# Patient Record
Sex: Male | Born: 1950 | Race: White | Hispanic: No | Marital: Married | Smoking: Never smoker
Health system: Southern US, Community
[De-identification: ages and names within clinical notes are randomized; demographics above are authoritative.]

## PROBLEM LIST (undated history)

## (undated) DIAGNOSIS — I10 Essential (primary) hypertension: Secondary | ICD-10-CM

## (undated) DIAGNOSIS — E7800 Pure hypercholesterolemia, unspecified: Secondary | ICD-10-CM

## (undated) DIAGNOSIS — J302 Other seasonal allergic rhinitis: Secondary | ICD-10-CM

## (undated) DIAGNOSIS — G20A1 Parkinson's disease without dyskinesia, without mention of fluctuations: Secondary | ICD-10-CM

## (undated) DIAGNOSIS — R269 Unspecified abnormalities of gait and mobility: Secondary | ICD-10-CM

## (undated) DIAGNOSIS — G20 Parkinson's disease: Secondary | ICD-10-CM

## (undated) DIAGNOSIS — E119 Type 2 diabetes mellitus without complications: Secondary | ICD-10-CM

## (undated) HISTORY — DX: Type 2 diabetes mellitus without complications: E11.9

## (undated) HISTORY — PX: HERNIA REPAIR: SHX51

## (undated) HISTORY — DX: Pure hypercholesterolemia, unspecified: E78.00

## (undated) HISTORY — PX: CHOLECYSTECTOMY: SHX55

## (undated) HISTORY — DX: Parkinson's disease without dyskinesia, without mention of fluctuations: G20.A1

## (undated) HISTORY — DX: Other seasonal allergic rhinitis: J30.2

## (undated) HISTORY — PX: APPENDECTOMY: SHX54

## (undated) HISTORY — DX: Unspecified abnormalities of gait and mobility: R26.9

## (undated) HISTORY — DX: Essential (primary) hypertension: I10

## (undated) HISTORY — DX: Parkinson's disease: G20

## (undated) HISTORY — PX: COLONOSCOPY: SHX174

---

## 2000-12-16 ENCOUNTER — Ambulatory Visit (HOSPITAL_COMMUNITY): Admission: RE | Admit: 2000-12-16 | Discharge: 2000-12-16 | Payer: Self-pay | Admitting: General Surgery

## 2011-11-30 DIAGNOSIS — R109 Unspecified abdominal pain: Secondary | ICD-10-CM

## 2015-09-21 DIAGNOSIS — Z0000 Encounter for general adult medical examination without abnormal findings: Secondary | ICD-10-CM | POA: Diagnosis not present

## 2015-09-21 DIAGNOSIS — Z1389 Encounter for screening for other disorder: Secondary | ICD-10-CM | POA: Diagnosis not present

## 2015-09-21 DIAGNOSIS — E1165 Type 2 diabetes mellitus with hyperglycemia: Secondary | ICD-10-CM | POA: Diagnosis not present

## 2015-09-21 DIAGNOSIS — Z1211 Encounter for screening for malignant neoplasm of colon: Secondary | ICD-10-CM | POA: Diagnosis not present

## 2015-09-21 DIAGNOSIS — E7800 Pure hypercholesterolemia, unspecified: Secondary | ICD-10-CM | POA: Diagnosis not present

## 2015-09-21 DIAGNOSIS — R5383 Other fatigue: Secondary | ICD-10-CM | POA: Diagnosis not present

## 2015-09-21 DIAGNOSIS — Z7189 Other specified counseling: Secondary | ICD-10-CM | POA: Diagnosis not present

## 2015-09-21 DIAGNOSIS — Z6827 Body mass index (BMI) 27.0-27.9, adult: Secondary | ICD-10-CM | POA: Diagnosis not present

## 2015-09-21 DIAGNOSIS — Z299 Encounter for prophylactic measures, unspecified: Secondary | ICD-10-CM | POA: Diagnosis not present

## 2015-09-22 DIAGNOSIS — E7800 Pure hypercholesterolemia, unspecified: Secondary | ICD-10-CM | POA: Diagnosis not present

## 2015-09-22 DIAGNOSIS — Z125 Encounter for screening for malignant neoplasm of prostate: Secondary | ICD-10-CM | POA: Diagnosis not present

## 2015-09-22 DIAGNOSIS — R5383 Other fatigue: Secondary | ICD-10-CM | POA: Diagnosis not present

## 2015-09-22 DIAGNOSIS — Z79899 Other long term (current) drug therapy: Secondary | ICD-10-CM | POA: Diagnosis not present

## 2015-10-17 DIAGNOSIS — H5203 Hypermetropia, bilateral: Secondary | ICD-10-CM | POA: Diagnosis not present

## 2015-10-17 DIAGNOSIS — H52223 Regular astigmatism, bilateral: Secondary | ICD-10-CM | POA: Diagnosis not present

## 2015-10-17 DIAGNOSIS — E119 Type 2 diabetes mellitus without complications: Secondary | ICD-10-CM | POA: Diagnosis not present

## 2015-10-17 DIAGNOSIS — Z7984 Long term (current) use of oral hypoglycemic drugs: Secondary | ICD-10-CM | POA: Diagnosis not present

## 2015-11-10 DIAGNOSIS — E7800 Pure hypercholesterolemia, unspecified: Secondary | ICD-10-CM | POA: Diagnosis not present

## 2015-11-10 DIAGNOSIS — E119 Type 2 diabetes mellitus without complications: Secondary | ICD-10-CM | POA: Diagnosis not present

## 2015-12-21 DIAGNOSIS — Z23 Encounter for immunization: Secondary | ICD-10-CM | POA: Diagnosis not present

## 2016-01-16 DIAGNOSIS — Z713 Dietary counseling and surveillance: Secondary | ICD-10-CM | POA: Diagnosis not present

## 2016-01-16 DIAGNOSIS — Z299 Encounter for prophylactic measures, unspecified: Secondary | ICD-10-CM | POA: Diagnosis not present

## 2016-01-16 DIAGNOSIS — Z6827 Body mass index (BMI) 27.0-27.9, adult: Secondary | ICD-10-CM | POA: Diagnosis not present

## 2016-01-16 DIAGNOSIS — E1165 Type 2 diabetes mellitus with hyperglycemia: Secondary | ICD-10-CM | POA: Diagnosis not present

## 2016-05-08 DIAGNOSIS — Z299 Encounter for prophylactic measures, unspecified: Secondary | ICD-10-CM | POA: Diagnosis not present

## 2016-05-08 DIAGNOSIS — Z713 Dietary counseling and surveillance: Secondary | ICD-10-CM | POA: Diagnosis not present

## 2016-05-08 DIAGNOSIS — M25511 Pain in right shoulder: Secondary | ICD-10-CM | POA: Diagnosis not present

## 2016-05-08 DIAGNOSIS — E1165 Type 2 diabetes mellitus with hyperglycemia: Secondary | ICD-10-CM | POA: Diagnosis not present

## 2016-05-08 DIAGNOSIS — Z6826 Body mass index (BMI) 26.0-26.9, adult: Secondary | ICD-10-CM | POA: Diagnosis not present

## 2016-08-21 DIAGNOSIS — H2513 Age-related nuclear cataract, bilateral: Secondary | ICD-10-CM | POA: Diagnosis not present

## 2016-08-21 DIAGNOSIS — Z7984 Long term (current) use of oral hypoglycemic drugs: Secondary | ICD-10-CM | POA: Diagnosis not present

## 2016-08-21 DIAGNOSIS — H43812 Vitreous degeneration, left eye: Secondary | ICD-10-CM | POA: Diagnosis not present

## 2016-08-21 DIAGNOSIS — E119 Type 2 diabetes mellitus without complications: Secondary | ICD-10-CM | POA: Diagnosis not present

## 2016-08-28 DIAGNOSIS — E7800 Pure hypercholesterolemia, unspecified: Secondary | ICD-10-CM | POA: Diagnosis not present

## 2016-08-28 DIAGNOSIS — E1165 Type 2 diabetes mellitus with hyperglycemia: Secondary | ICD-10-CM | POA: Diagnosis not present

## 2016-08-28 DIAGNOSIS — Z299 Encounter for prophylactic measures, unspecified: Secondary | ICD-10-CM | POA: Diagnosis not present

## 2016-08-28 DIAGNOSIS — Z713 Dietary counseling and surveillance: Secondary | ICD-10-CM | POA: Diagnosis not present

## 2016-08-28 DIAGNOSIS — Z6826 Body mass index (BMI) 26.0-26.9, adult: Secondary | ICD-10-CM | POA: Diagnosis not present

## 2016-10-03 DIAGNOSIS — Z7189 Other specified counseling: Secondary | ICD-10-CM | POA: Diagnosis not present

## 2016-10-03 DIAGNOSIS — Z0000 Encounter for general adult medical examination without abnormal findings: Secondary | ICD-10-CM | POA: Diagnosis not present

## 2016-10-03 DIAGNOSIS — Z79899 Other long term (current) drug therapy: Secondary | ICD-10-CM | POA: Diagnosis not present

## 2016-10-03 DIAGNOSIS — E1165 Type 2 diabetes mellitus with hyperglycemia: Secondary | ICD-10-CM | POA: Diagnosis not present

## 2016-10-03 DIAGNOSIS — R5383 Other fatigue: Secondary | ICD-10-CM | POA: Diagnosis not present

## 2016-10-03 DIAGNOSIS — E7800 Pure hypercholesterolemia, unspecified: Secondary | ICD-10-CM | POA: Diagnosis not present

## 2016-10-03 DIAGNOSIS — Z125 Encounter for screening for malignant neoplasm of prostate: Secondary | ICD-10-CM | POA: Diagnosis not present

## 2016-10-03 DIAGNOSIS — Z1389 Encounter for screening for other disorder: Secondary | ICD-10-CM | POA: Diagnosis not present

## 2016-10-03 DIAGNOSIS — Z1211 Encounter for screening for malignant neoplasm of colon: Secondary | ICD-10-CM | POA: Diagnosis not present

## 2016-10-03 DIAGNOSIS — Z299 Encounter for prophylactic measures, unspecified: Secondary | ICD-10-CM | POA: Diagnosis not present

## 2016-10-03 DIAGNOSIS — Z713 Dietary counseling and surveillance: Secondary | ICD-10-CM | POA: Diagnosis not present

## 2016-10-03 DIAGNOSIS — Z6826 Body mass index (BMI) 26.0-26.9, adult: Secondary | ICD-10-CM | POA: Diagnosis not present

## 2016-10-04 DIAGNOSIS — Z79899 Other long term (current) drug therapy: Secondary | ICD-10-CM | POA: Diagnosis not present

## 2016-10-04 DIAGNOSIS — Z125 Encounter for screening for malignant neoplasm of prostate: Secondary | ICD-10-CM | POA: Diagnosis not present

## 2016-10-04 DIAGNOSIS — R5383 Other fatigue: Secondary | ICD-10-CM | POA: Diagnosis not present

## 2016-10-04 DIAGNOSIS — E7800 Pure hypercholesterolemia, unspecified: Secondary | ICD-10-CM | POA: Diagnosis not present

## 2016-10-22 DIAGNOSIS — J209 Acute bronchitis, unspecified: Secondary | ICD-10-CM | POA: Diagnosis not present

## 2016-10-22 DIAGNOSIS — Z299 Encounter for prophylactic measures, unspecified: Secondary | ICD-10-CM | POA: Diagnosis not present

## 2016-10-22 DIAGNOSIS — E1165 Type 2 diabetes mellitus with hyperglycemia: Secondary | ICD-10-CM | POA: Diagnosis not present

## 2016-10-22 DIAGNOSIS — E7800 Pure hypercholesterolemia, unspecified: Secondary | ICD-10-CM | POA: Diagnosis not present

## 2016-10-22 DIAGNOSIS — Z6826 Body mass index (BMI) 26.0-26.9, adult: Secondary | ICD-10-CM | POA: Diagnosis not present

## 2016-10-22 DIAGNOSIS — M545 Low back pain: Secondary | ICD-10-CM | POA: Diagnosis not present

## 2016-11-29 DIAGNOSIS — H353131 Nonexudative age-related macular degeneration, bilateral, early dry stage: Secondary | ICD-10-CM | POA: Diagnosis not present

## 2016-12-04 DIAGNOSIS — E7800 Pure hypercholesterolemia, unspecified: Secondary | ICD-10-CM | POA: Diagnosis not present

## 2016-12-04 DIAGNOSIS — E1165 Type 2 diabetes mellitus with hyperglycemia: Secondary | ICD-10-CM | POA: Diagnosis not present

## 2016-12-04 DIAGNOSIS — Z713 Dietary counseling and surveillance: Secondary | ICD-10-CM | POA: Diagnosis not present

## 2016-12-04 DIAGNOSIS — Z299 Encounter for prophylactic measures, unspecified: Secondary | ICD-10-CM | POA: Diagnosis not present

## 2016-12-04 DIAGNOSIS — Z6826 Body mass index (BMI) 26.0-26.9, adult: Secondary | ICD-10-CM | POA: Diagnosis not present

## 2016-12-31 DIAGNOSIS — Z23 Encounter for immunization: Secondary | ICD-10-CM | POA: Diagnosis not present

## 2017-03-21 DIAGNOSIS — Z713 Dietary counseling and surveillance: Secondary | ICD-10-CM | POA: Diagnosis not present

## 2017-03-21 DIAGNOSIS — Z299 Encounter for prophylactic measures, unspecified: Secondary | ICD-10-CM | POA: Diagnosis not present

## 2017-03-21 DIAGNOSIS — E1165 Type 2 diabetes mellitus with hyperglycemia: Secondary | ICD-10-CM | POA: Diagnosis not present

## 2017-03-21 DIAGNOSIS — Z6825 Body mass index (BMI) 25.0-25.9, adult: Secondary | ICD-10-CM | POA: Diagnosis not present

## 2017-04-25 DIAGNOSIS — E1140 Type 2 diabetes mellitus with diabetic neuropathy, unspecified: Secondary | ICD-10-CM | POA: Diagnosis not present

## 2017-04-25 DIAGNOSIS — M79671 Pain in right foot: Secondary | ICD-10-CM | POA: Diagnosis not present

## 2017-04-25 DIAGNOSIS — M79672 Pain in left foot: Secondary | ICD-10-CM | POA: Diagnosis not present

## 2017-07-18 DIAGNOSIS — Z6824 Body mass index (BMI) 24.0-24.9, adult: Secondary | ICD-10-CM | POA: Diagnosis not present

## 2017-07-18 DIAGNOSIS — E1165 Type 2 diabetes mellitus with hyperglycemia: Secondary | ICD-10-CM | POA: Diagnosis not present

## 2017-07-18 DIAGNOSIS — Z299 Encounter for prophylactic measures, unspecified: Secondary | ICD-10-CM | POA: Diagnosis not present

## 2017-07-18 DIAGNOSIS — Z713 Dietary counseling and surveillance: Secondary | ICD-10-CM | POA: Diagnosis not present

## 2017-09-27 DIAGNOSIS — Z299 Encounter for prophylactic measures, unspecified: Secondary | ICD-10-CM | POA: Diagnosis not present

## 2017-09-27 DIAGNOSIS — B356 Tinea cruris: Secondary | ICD-10-CM | POA: Diagnosis not present

## 2017-09-27 DIAGNOSIS — H109 Unspecified conjunctivitis: Secondary | ICD-10-CM | POA: Diagnosis not present

## 2017-09-27 DIAGNOSIS — Z713 Dietary counseling and surveillance: Secondary | ICD-10-CM | POA: Diagnosis not present

## 2017-09-27 DIAGNOSIS — Z6824 Body mass index (BMI) 24.0-24.9, adult: Secondary | ICD-10-CM | POA: Diagnosis not present

## 2017-10-03 DIAGNOSIS — E1140 Type 2 diabetes mellitus with diabetic neuropathy, unspecified: Secondary | ICD-10-CM | POA: Diagnosis not present

## 2017-10-03 DIAGNOSIS — M79672 Pain in left foot: Secondary | ICD-10-CM | POA: Diagnosis not present

## 2017-10-03 DIAGNOSIS — M79671 Pain in right foot: Secondary | ICD-10-CM | POA: Diagnosis not present

## 2017-10-03 DIAGNOSIS — G600 Hereditary motor and sensory neuropathy: Secondary | ICD-10-CM | POA: Diagnosis not present

## 2017-10-11 DIAGNOSIS — Z1339 Encounter for screening examination for other mental health and behavioral disorders: Secondary | ICD-10-CM | POA: Diagnosis not present

## 2017-10-11 DIAGNOSIS — Z79899 Other long term (current) drug therapy: Secondary | ICD-10-CM | POA: Diagnosis not present

## 2017-10-11 DIAGNOSIS — Z0000 Encounter for general adult medical examination without abnormal findings: Secondary | ICD-10-CM | POA: Diagnosis not present

## 2017-10-11 DIAGNOSIS — Z1331 Encounter for screening for depression: Secondary | ICD-10-CM | POA: Diagnosis not present

## 2017-10-11 DIAGNOSIS — E1165 Type 2 diabetes mellitus with hyperglycemia: Secondary | ICD-10-CM | POA: Diagnosis not present

## 2017-10-11 DIAGNOSIS — Z1211 Encounter for screening for malignant neoplasm of colon: Secondary | ICD-10-CM | POA: Diagnosis not present

## 2017-10-11 DIAGNOSIS — E7800 Pure hypercholesterolemia, unspecified: Secondary | ICD-10-CM | POA: Diagnosis not present

## 2017-10-11 DIAGNOSIS — R5383 Other fatigue: Secondary | ICD-10-CM | POA: Diagnosis not present

## 2017-10-11 DIAGNOSIS — Z6825 Body mass index (BMI) 25.0-25.9, adult: Secondary | ICD-10-CM | POA: Diagnosis not present

## 2017-10-11 DIAGNOSIS — Z7189 Other specified counseling: Secondary | ICD-10-CM | POA: Diagnosis not present

## 2017-10-11 DIAGNOSIS — Z299 Encounter for prophylactic measures, unspecified: Secondary | ICD-10-CM | POA: Diagnosis not present

## 2017-10-11 DIAGNOSIS — Z125 Encounter for screening for malignant neoplasm of prostate: Secondary | ICD-10-CM | POA: Diagnosis not present

## 2017-11-28 DIAGNOSIS — E1165 Type 2 diabetes mellitus with hyperglycemia: Secondary | ICD-10-CM | POA: Diagnosis not present

## 2017-11-28 DIAGNOSIS — E7800 Pure hypercholesterolemia, unspecified: Secondary | ICD-10-CM | POA: Diagnosis not present

## 2017-11-28 DIAGNOSIS — R079 Chest pain, unspecified: Secondary | ICD-10-CM | POA: Diagnosis not present

## 2017-11-28 DIAGNOSIS — Z6825 Body mass index (BMI) 25.0-25.9, adult: Secondary | ICD-10-CM | POA: Diagnosis not present

## 2017-11-28 DIAGNOSIS — Z299 Encounter for prophylactic measures, unspecified: Secondary | ICD-10-CM | POA: Diagnosis not present

## 2017-12-09 DIAGNOSIS — R079 Chest pain, unspecified: Secondary | ICD-10-CM | POA: Diagnosis not present

## 2017-12-13 DIAGNOSIS — R0602 Shortness of breath: Secondary | ICD-10-CM | POA: Diagnosis not present

## 2018-01-02 DIAGNOSIS — Z23 Encounter for immunization: Secondary | ICD-10-CM | POA: Diagnosis not present

## 2018-03-03 DIAGNOSIS — Z299 Encounter for prophylactic measures, unspecified: Secondary | ICD-10-CM | POA: Diagnosis not present

## 2018-03-03 DIAGNOSIS — R0789 Other chest pain: Secondary | ICD-10-CM | POA: Diagnosis not present

## 2018-03-03 DIAGNOSIS — E7800 Pure hypercholesterolemia, unspecified: Secondary | ICD-10-CM | POA: Diagnosis not present

## 2018-03-03 DIAGNOSIS — I10 Essential (primary) hypertension: Secondary | ICD-10-CM | POA: Diagnosis not present

## 2018-03-03 DIAGNOSIS — Z6825 Body mass index (BMI) 25.0-25.9, adult: Secondary | ICD-10-CM | POA: Diagnosis not present

## 2018-03-03 DIAGNOSIS — E1165 Type 2 diabetes mellitus with hyperglycemia: Secondary | ICD-10-CM | POA: Diagnosis not present

## 2018-03-06 DIAGNOSIS — R0789 Other chest pain: Secondary | ICD-10-CM | POA: Diagnosis not present

## 2018-04-01 DIAGNOSIS — I739 Peripheral vascular disease, unspecified: Secondary | ICD-10-CM | POA: Diagnosis not present

## 2018-04-01 DIAGNOSIS — M79672 Pain in left foot: Secondary | ICD-10-CM | POA: Diagnosis not present

## 2018-04-01 DIAGNOSIS — E1140 Type 2 diabetes mellitus with diabetic neuropathy, unspecified: Secondary | ICD-10-CM | POA: Diagnosis not present

## 2018-04-01 DIAGNOSIS — M79671 Pain in right foot: Secondary | ICD-10-CM | POA: Diagnosis not present

## 2018-04-01 DIAGNOSIS — L110 Acquired keratosis follicularis: Secondary | ICD-10-CM | POA: Diagnosis not present

## 2018-04-15 DIAGNOSIS — E1165 Type 2 diabetes mellitus with hyperglycemia: Secondary | ICD-10-CM | POA: Diagnosis not present

## 2018-04-15 DIAGNOSIS — R1310 Dysphagia, unspecified: Secondary | ICD-10-CM | POA: Diagnosis not present

## 2018-04-15 DIAGNOSIS — I10 Essential (primary) hypertension: Secondary | ICD-10-CM | POA: Diagnosis not present

## 2018-04-15 DIAGNOSIS — E7800 Pure hypercholesterolemia, unspecified: Secondary | ICD-10-CM | POA: Diagnosis not present

## 2018-04-15 DIAGNOSIS — R42 Dizziness and giddiness: Secondary | ICD-10-CM | POA: Diagnosis not present

## 2018-04-15 DIAGNOSIS — R279 Unspecified lack of coordination: Secondary | ICD-10-CM | POA: Diagnosis not present

## 2018-04-15 DIAGNOSIS — Z6823 Body mass index (BMI) 23.0-23.9, adult: Secondary | ICD-10-CM | POA: Diagnosis not present

## 2018-04-15 DIAGNOSIS — R5383 Other fatigue: Secondary | ICD-10-CM | POA: Diagnosis not present

## 2018-04-15 DIAGNOSIS — Z79899 Other long term (current) drug therapy: Secondary | ICD-10-CM | POA: Diagnosis not present

## 2018-04-15 DIAGNOSIS — Z299 Encounter for prophylactic measures, unspecified: Secondary | ICD-10-CM | POA: Diagnosis not present

## 2018-04-24 DIAGNOSIS — R42 Dizziness and giddiness: Secondary | ICD-10-CM | POA: Diagnosis not present

## 2018-04-24 DIAGNOSIS — R1313 Dysphagia, pharyngeal phase: Secondary | ICD-10-CM | POA: Diagnosis not present

## 2018-04-29 DIAGNOSIS — I10 Essential (primary) hypertension: Secondary | ICD-10-CM | POA: Diagnosis not present

## 2018-04-29 DIAGNOSIS — E7800 Pure hypercholesterolemia, unspecified: Secondary | ICD-10-CM | POA: Diagnosis not present

## 2018-04-29 DIAGNOSIS — E1165 Type 2 diabetes mellitus with hyperglycemia: Secondary | ICD-10-CM | POA: Diagnosis not present

## 2018-04-29 DIAGNOSIS — Z299 Encounter for prophylactic measures, unspecified: Secondary | ICD-10-CM | POA: Diagnosis not present

## 2018-04-29 DIAGNOSIS — Z6823 Body mass index (BMI) 23.0-23.9, adult: Secondary | ICD-10-CM | POA: Diagnosis not present

## 2018-04-29 DIAGNOSIS — R1310 Dysphagia, unspecified: Secondary | ICD-10-CM | POA: Diagnosis not present

## 2018-06-12 DIAGNOSIS — Z299 Encounter for prophylactic measures, unspecified: Secondary | ICD-10-CM | POA: Diagnosis not present

## 2018-06-12 DIAGNOSIS — E1165 Type 2 diabetes mellitus with hyperglycemia: Secondary | ICD-10-CM | POA: Diagnosis not present

## 2018-06-12 DIAGNOSIS — E7800 Pure hypercholesterolemia, unspecified: Secondary | ICD-10-CM | POA: Diagnosis not present

## 2018-06-12 DIAGNOSIS — Z6823 Body mass index (BMI) 23.0-23.9, adult: Secondary | ICD-10-CM | POA: Diagnosis not present

## 2018-06-12 DIAGNOSIS — I10 Essential (primary) hypertension: Secondary | ICD-10-CM | POA: Diagnosis not present

## 2018-06-17 DIAGNOSIS — I739 Peripheral vascular disease, unspecified: Secondary | ICD-10-CM | POA: Diagnosis not present

## 2018-06-17 DIAGNOSIS — L110 Acquired keratosis follicularis: Secondary | ICD-10-CM | POA: Diagnosis not present

## 2018-06-17 DIAGNOSIS — M79672 Pain in left foot: Secondary | ICD-10-CM | POA: Diagnosis not present

## 2018-06-17 DIAGNOSIS — M79671 Pain in right foot: Secondary | ICD-10-CM | POA: Diagnosis not present

## 2018-06-17 DIAGNOSIS — E1140 Type 2 diabetes mellitus with diabetic neuropathy, unspecified: Secondary | ICD-10-CM | POA: Diagnosis not present

## 2018-07-03 ENCOUNTER — Ambulatory Visit: Payer: Medicare Other | Admitting: Neurology

## 2018-07-10 DIAGNOSIS — I10 Essential (primary) hypertension: Secondary | ICD-10-CM | POA: Diagnosis not present

## 2018-08-08 DIAGNOSIS — I10 Essential (primary) hypertension: Secondary | ICD-10-CM | POA: Diagnosis not present

## 2018-09-02 DIAGNOSIS — M79672 Pain in left foot: Secondary | ICD-10-CM | POA: Diagnosis not present

## 2018-09-02 DIAGNOSIS — L110 Acquired keratosis follicularis: Secondary | ICD-10-CM | POA: Diagnosis not present

## 2018-09-02 DIAGNOSIS — E1140 Type 2 diabetes mellitus with diabetic neuropathy, unspecified: Secondary | ICD-10-CM | POA: Diagnosis not present

## 2018-09-02 DIAGNOSIS — I739 Peripheral vascular disease, unspecified: Secondary | ICD-10-CM | POA: Diagnosis not present

## 2018-09-02 DIAGNOSIS — M79671 Pain in right foot: Secondary | ICD-10-CM | POA: Diagnosis not present

## 2018-09-04 ENCOUNTER — Ambulatory Visit: Payer: Medicare Other | Admitting: Neurology

## 2018-09-04 DIAGNOSIS — J069 Acute upper respiratory infection, unspecified: Secondary | ICD-10-CM | POA: Diagnosis not present

## 2018-09-04 DIAGNOSIS — Z299 Encounter for prophylactic measures, unspecified: Secondary | ICD-10-CM | POA: Diagnosis not present

## 2018-09-04 DIAGNOSIS — Z713 Dietary counseling and surveillance: Secondary | ICD-10-CM | POA: Diagnosis not present

## 2018-09-05 DIAGNOSIS — I10 Essential (primary) hypertension: Secondary | ICD-10-CM | POA: Diagnosis not present

## 2018-09-10 ENCOUNTER — Encounter: Payer: Self-pay | Admitting: *Deleted

## 2018-09-11 ENCOUNTER — Other Ambulatory Visit: Payer: Self-pay

## 2018-09-11 ENCOUNTER — Ambulatory Visit (INDEPENDENT_AMBULATORY_CARE_PROVIDER_SITE_OTHER): Payer: Medicare Other | Admitting: Neurology

## 2018-09-11 ENCOUNTER — Encounter: Payer: Self-pay | Admitting: Neurology

## 2018-09-11 VITALS — BP 142/75 | HR 82 | Temp 98.0°F | Ht 69.0 in | Wt 156.0 lb

## 2018-09-11 DIAGNOSIS — G20 Parkinson's disease: Secondary | ICD-10-CM | POA: Diagnosis not present

## 2018-09-11 DIAGNOSIS — G20A1 Parkinson's disease without dyskinesia, without mention of fluctuations: Secondary | ICD-10-CM | POA: Insufficient documentation

## 2018-09-11 MED ORDER — ROPINIROLE HCL ER 2 MG PO TB24: 4.0000 mg | ORAL_TABLET | Freq: Every day | ORAL | 11 refills | Status: DC

## 2018-09-11 NOTE — Progress Notes (Signed)
PATIENT: Matthew Graham DOB: 07/09/1950  Chief Complaint  Patient presents with  . Rule out Parkinsons Disease    He is here with his wife, Matthew Graham.  They are concerned about the following symptoms: abnormal gait, weight loss, difficulty swallowing and flat affect.  Denies tremors.  Recent brain MRI showing small vessel ischemia.     Matthew Graham. PCP    Matthew Graham, Matthew B, MD     HISTORICAL  Matthew CongressKenneth Ronald Graham is a 68 year old male, seen in request by his primary care physician [name], Matthew for evaluation of possible Parkinson's disease, he is accompanied by his wife Matthew Graham, initial evaluation was on September 11, 2018.  I have reviewed and summarized the referring note from the referring physician.  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  Patient [facility], laboratory evaluations was done in February  REVIEW OF SYSTEMS: Full 14 system review of systems performed and notable only for as above All other review of systems were negative.  ALLERGIES: Allergies  Allergen Reactions  . Lisinopril Nausea Only  . Other     Poliovax caused fatigue.    HOME MEDICATIONS: Current Outpatient Medications  Medication Sig Dispense Refill  . amLODipine (NORVASC) 5 MG tablet Take 5 mg by mouth daily.    Matthew Graham. atorvastatin (LIPITOR) 10 MG tablet Take 10 mg by mouth daily.    . irbesartan (AVAPRO) 300 MG tablet Take 1 tablet by mouth daily.    . metFORMIN (GLUCOPHAGE-XR)  500 MG 24 hr tablet Take 500 mg by mouth daily.    . Polyvinyl Alcohol-Povidone (REFRESH OP) Apply 1 drop to eye as needed.     No current facility-administered medications for this visit.     PAST MEDICAL HISTORY: Past Medical History:  Diagnosis Date  . Abnormal gait   . Diabetes (HCC)   . Hypercholesteremia   . Hypertension   . Seasonal allergies     PAST SURGICAL HISTORY: Past Surgical History:  Procedure Laterality Date  . APPENDECTOMY    . CHOLECYSTECTOMY    . COLONOSCOPY     normal screening  . HERNIA REPAIR      FAMILY HISTORY: Family History  Problem Relation Age of Onset  . Alzheimer's disease Mother   . Diabetes Mother   . Hypertension Mother   . Other Father        potential complications from hip surgery    SOCIAL HISTORY: Social History   Socioeconomic History  . Marital status: Married    Spouse name: Not on file  . Number of children: 2  . Years of education: 6212  . Highest education level: High school graduate  Occupational History  . Occupation: Retired  Engineer, productionocial Needs  . Financial resource strain: Not on file  . Food insecurity    Worry: Not on file    Inability: Not on file  . Transportation needs    Medical: Not on file    Non-medical: Not on file  Tobacco Use  .  Smoking status: Never Smoker  . Smokeless tobacco: Never Used  Substance and Sexual Activity  . Alcohol use: Not Currently  . Drug use: Never  . Sexual activity: Not on file  Lifestyle  . Physical activity    Days per week: Not on file    Minutes per session: Not on file  . Stress: Not on file  Relationships  . Social Herbalist on phone: Not on file    Gets together: Not on file    Attends religious service: Not on file    Active member of club or organization: Not on file    Attends meetings of clubs or organizations: Not on file    Relationship status: Not on file  . Intimate partner violence    Fear of current or ex partner: Not on file     Emotionally abused: Not on file    Physically abused: Not on file    Forced sexual activity: Not on file  Other Topics Concern  . Not on file  Social History Narrative   Lives at home with wife.    Right-handed.   Rare caffeine use.     PHYSICAL EXAM   Vitals:   09/11/18 1338  BP: (!) 142/75  Pulse: 82  Temp: 98 F (36.7 C)  Weight: 156 lb (70.8 kg)  Height: 5\' 9"  (1.753 m)    Not recorded      Body mass index is 23.04 kg/m.  PHYSICAL EXAMNIATION:  Gen: NAD, conversant, well nourised, obese, well groomed                     Cardiovascular: Regular rate rhythm, no peripheral edema, warm, nontender. Eyes: Conjunctivae clear without exudates or hemorrhage Neck: Supple, no carotid bruits. Pulmonary: Clear to auscultation bilaterally   NEUROLOGICAL EXAM:  MENTAL STATUS: Speech:    Speech is normal; fluent and spontaneous with normal comprehension.  Cognition:     Orientation to time, place and person     Normal recent and remote memory     Normal Attention span and concentration     Normal Language, naming, repeating,spontaneous speech     Fund of knowledge   CRANIAL NERVES: CN II: Visual fields are full to confrontation.   Pupils are round equal and briskly reactive to light. CN III, IV, VI:   No ptosis.  Smooth pursuit was broken up into small catch-up saccade, mild difficulty with vertical gaze CN V: Facial sensation is intact to pinprick in all 3 divisions bilaterally. Corneal responses are intact.  CN VII: Face is symmetric with normal eye closure and smile. CN VIII: Hearing is normal to rubbing fingers CN IX, X: Palate elevates symmetrically. Phonation is normal. CN XI: Head turning and shoulder shrug are intact CN XII: Tongue is midline with normal movements and no atrophy.  MOTOR: He has mild right hand and arm resting tremor, there was no significant weakness, he has rigidity of right upper extremity more than left side, he has difficulty with right  wrist opening and closing, difficulty with right foot tapping,  REFLEXES: Reflexes are 2+ and symmetric at the biceps, triceps, knees, and ankles. Plantar responses are flexor.  SENSORY: Intact to light touch, pinprick, positional sensation and vibratory sensation are intact in fingers and toes.  COORDINATION: Rapid alternating movements and fine finger movements are intact. There is no dysmetria on finger-to-nose and heel-knee-shin.    GAIT/STANCE: He needs to push up to get up from seated  position, leaning forward, decreased right arm swing, mild decreased stride   DIAGNOSTIC DATA (LABS, IMAGING, TESTING) - I reviewed patient records, labs, notes, testing and imaging myself where available.   ASSESSMENT AND PLAN  Matthew CongressKenneth Ronald Ebner is a 68 y.o. male   Parkinson's disease  Get MRI CD to review  Get laboratory evaluations  Start Requip XR 2 mg titrating to 2 tablets every night  Encouraged him moderate exercise   Levert FeinsteinYijun Tylesha Gibeault, M.D. Ph.D.  [facility] [address], [address] [place], [place] [address] Ph: [phone] Fax: [phone]  CC: Referring Provider

## 2018-09-15 ENCOUNTER — Telehealth: Payer: Self-pay

## 2018-09-15 MED ORDER — ROPINIROLE HCL 1 MG PO TABS: 1.0000 mg | ORAL_TABLET | Freq: Three times a day (TID) | ORAL | 11 refills | Status: DC

## 2018-09-15 NOTE — Telephone Encounter (Signed)
I have called in Requip 1mg  tid.x90 tabs with 11 refills.

## 2018-09-15 NOTE — Telephone Encounter (Signed)
I called [facility] and spoke to Wilder.  She has voided the ropinirole XL 2mg  prescription.  They will prepare the new rx below for pick up.  I also called the patient.  He is aware and agreeable to this medication change.

## 2018-09-15 NOTE — Telephone Encounter (Signed)
His insurance plan will not cover the ropinirole XL without him failing the immediate release first.  If he uses a goodrx.com coupon at [facility], he can fill the XL prescription for $28.43.  He would rather be changed to immediate release, if appropriate, so that his insurance will cover the cost.  He would like the new prescription sent to [facility] (updated in his chart).

## 2018-09-15 NOTE — Telephone Encounter (Signed)
Patient is calling stating that the pharmacy is telling him that the Requip that was prescribed on Thursday needs a prior authorization. Pharmacy confirmed.

## 2018-09-15 NOTE — Addendum Note (Signed)
Addended by: Marcial Pacas on: 09/15/2018 11:33 AM   Modules accepted: Orders

## 2018-09-18 DIAGNOSIS — E7800 Pure hypercholesterolemia, unspecified: Secondary | ICD-10-CM | POA: Diagnosis not present

## 2018-09-18 DIAGNOSIS — I10 Essential (primary) hypertension: Secondary | ICD-10-CM | POA: Diagnosis not present

## 2018-09-18 DIAGNOSIS — E1165 Type 2 diabetes mellitus with hyperglycemia: Secondary | ICD-10-CM | POA: Diagnosis not present

## 2018-09-18 DIAGNOSIS — Z6823 Body mass index (BMI) 23.0-23.9, adult: Secondary | ICD-10-CM | POA: Diagnosis not present

## 2018-09-18 DIAGNOSIS — G20 Parkinson's disease: Secondary | ICD-10-CM | POA: Diagnosis not present

## 2018-09-18 DIAGNOSIS — Z299 Encounter for prophylactic measures, unspecified: Secondary | ICD-10-CM | POA: Diagnosis not present

## 2018-10-10 DIAGNOSIS — I10 Essential (primary) hypertension: Secondary | ICD-10-CM | POA: Diagnosis not present

## 2018-10-17 DIAGNOSIS — E7800 Pure hypercholesterolemia, unspecified: Secondary | ICD-10-CM | POA: Diagnosis not present

## 2018-10-17 DIAGNOSIS — Z0000 Encounter for general adult medical examination without abnormal findings: Secondary | ICD-10-CM | POA: Diagnosis not present

## 2018-10-17 DIAGNOSIS — Z1211 Encounter for screening for malignant neoplasm of colon: Secondary | ICD-10-CM | POA: Diagnosis not present

## 2018-10-17 DIAGNOSIS — Z299 Encounter for prophylactic measures, unspecified: Secondary | ICD-10-CM | POA: Diagnosis not present

## 2018-10-17 DIAGNOSIS — R5383 Other fatigue: Secondary | ICD-10-CM | POA: Diagnosis not present

## 2018-10-17 DIAGNOSIS — E1165 Type 2 diabetes mellitus with hyperglycemia: Secondary | ICD-10-CM | POA: Diagnosis not present

## 2018-10-17 DIAGNOSIS — Z1339 Encounter for screening examination for other mental health and behavioral disorders: Secondary | ICD-10-CM | POA: Diagnosis not present

## 2018-10-17 DIAGNOSIS — Z6822 Body mass index (BMI) 22.0-22.9, adult: Secondary | ICD-10-CM | POA: Diagnosis not present

## 2018-10-17 DIAGNOSIS — Z79899 Other long term (current) drug therapy: Secondary | ICD-10-CM | POA: Diagnosis not present

## 2018-10-17 DIAGNOSIS — Z1331 Encounter for screening for depression: Secondary | ICD-10-CM | POA: Diagnosis not present

## 2018-10-17 DIAGNOSIS — Z125 Encounter for screening for malignant neoplasm of prostate: Secondary | ICD-10-CM | POA: Diagnosis not present

## 2018-10-17 DIAGNOSIS — Z7189 Other specified counseling: Secondary | ICD-10-CM | POA: Diagnosis not present

## 2018-11-24 DIAGNOSIS — L110 Acquired keratosis follicularis: Secondary | ICD-10-CM | POA: Diagnosis not present

## 2018-11-24 DIAGNOSIS — I739 Peripheral vascular disease, unspecified: Secondary | ICD-10-CM | POA: Diagnosis not present

## 2018-11-24 DIAGNOSIS — M79672 Pain in left foot: Secondary | ICD-10-CM | POA: Diagnosis not present

## 2018-11-24 DIAGNOSIS — M79671 Pain in right foot: Secondary | ICD-10-CM | POA: Diagnosis not present

## 2018-11-24 DIAGNOSIS — E1140 Type 2 diabetes mellitus with diabetic neuropathy, unspecified: Secondary | ICD-10-CM | POA: Diagnosis not present

## 2018-12-18 ENCOUNTER — Telehealth: Payer: Self-pay | Admitting: Neurology

## 2018-12-18 ENCOUNTER — Ambulatory Visit (INDEPENDENT_AMBULATORY_CARE_PROVIDER_SITE_OTHER): Payer: Medicare Other | Admitting: Neurology

## 2018-12-18 ENCOUNTER — Ambulatory Visit: Payer: Medicare Other | Admitting: Neurology

## 2018-12-18 ENCOUNTER — Encounter: Payer: Self-pay | Admitting: Neurology

## 2018-12-18 ENCOUNTER — Other Ambulatory Visit: Payer: Self-pay

## 2018-12-18 VITALS — BP 155/82 | HR 75 | Temp 97.9°F | Ht 69.0 in | Wt 155.0 lb

## 2018-12-18 DIAGNOSIS — H93239 Hyperacusis, unspecified ear: Secondary | ICD-10-CM

## 2018-12-18 DIAGNOSIS — H93299 Other abnormal auditory perceptions, unspecified ear: Secondary | ICD-10-CM | POA: Insufficient documentation

## 2018-12-18 DIAGNOSIS — G20 Parkinson's disease: Secondary | ICD-10-CM

## 2018-12-18 MED ORDER — ROPINIROLE HCL 2 MG PO TABS: 2.0000 mg | ORAL_TABLET | Freq: Three times a day (TID) | ORAL | 11 refills | Status: DC

## 2018-12-18 MED ORDER — RASAGILINE MESYLATE 1 MG PO TABS: 1.0000 mg | ORAL_TABLET | Freq: Every day | ORAL | 11 refills | Status: DC

## 2018-12-18 MED ORDER — ROPINIROLE HCL 2 MG PO TABS: 1.0000 mg | ORAL_TABLET | Freq: Three times a day (TID) | ORAL | 11 refills | Status: DC

## 2018-12-18 NOTE — Telephone Encounter (Signed)
Ok to hold off azilect or selegiline now, let's first see how he responds to the higher dose of Requip 2mg  tid

## 2018-12-18 NOTE — Telephone Encounter (Signed)
Called pharmacist, Matt who asked about clarification on requip Rx received today. He stated as it was sent in, it is not an increase in his dose. The patient told him Dr Krista Blue increased dose.  Per note today: Requip XR 2 mg titrating to 2 tablets every night. Rx sent in is Requip 2 mg tabs, Take 0.5 tablets (1 mg total) by mouth 3 (three) times daily.  MAtt also stated the Rx for Rasagiline will cost $322/ 30 days. He asked if it could be substituted with  Selegiline 5 mg, take 2 x day which will cost $94/ 30 days.  I advised will send to Dr Krista Blue for clarification on Requip and answer to question about switching rasagiline.  Matt  verbalized understanding, appreciation.

## 2018-12-18 NOTE — Telephone Encounter (Signed)
Please call patient, you were reading of the note from my previous record, updated note should be Requip 2 mg 1 tablet 3 times a day,

## 2018-12-18 NOTE — Progress Notes (Signed)
PATIENT: Landan Graham DOB: 09-04-1950  Chief Complaint  Patient presents with  . Parkinson's disease    rm 4, 3 month FU, dgtr- Lawson Fiscal, "review MRI results"     HISTORICAL  Matthew Graham is a 68 year old male, seen in request by his primary care physician [name], [name] for evaluation of possible Parkinson's disease, he is accompanied by his wife [name], initial evaluation was on September 11, 2018.  I have reviewed and summarized the referring note from the referring physician.  He had a past medical history of attention, hyperlipidemia, diabetes, still works part-time job, delivery of medicine for [employer]  Around early 2020, he was noted to have gradual onset right hand tremor, change of walking posturing, leaning forward, he also noticed mild rigidity of right leg, difficulty making rapid rhythmic movements with his right foot  He does have couple years history of gradually decreased sense of smell, he denies REM sleep disorder, when he bends down and gets up quickly, he sometime has lightheadedness sensation.  Patient reported normal MRI of brain at [facility], laboratory evaluations was done in February  UPDATE Dec 18 2018: He is tolerating Requip 1 mg instead of 3 times a day, he only take 1 mg twice a day, he denies significant side effect, he still work part-time job driving cars delivering medications, he does notice some improvement, he can move better  We personally reviewed MRI of the brain, mild supratentorium small vessel disease, there was no acute abnormality   REVIEW OF SYSTEMS: Full 14 system review of systems performed and notable only for as above All other review of systems were negative.  ALLERGIES: Allergies  Allergen Reactions  . Lisinopril Nausea Only  . Other     Poliovax caused fatigue.    HOME MEDICATIONS: Current Outpatient Medications  Medication Sig Dispense Refill  . amLODipine (NORVASC) 5 MG tablet Take 5 mg by mouth  daily.    Marland Kitchen atorvastatin (LIPITOR) 10 MG tablet Take 10 mg by mouth daily.    . irbesartan (AVAPRO) 300 MG tablet Take 1 tablet by mouth daily.    . metFORMIN (GLUCOPHAGE-XR) 500 MG 24 hr tablet Take 500 mg by mouth daily.    . Polyvinyl Alcohol-Povidone (REFRESH OP) Apply 1 drop to eye as needed.    Marland Kitchen rOPINIRole (REQUIP) 1 MG tablet Take 1 tablet (1 mg total) by mouth 3 (three) times daily. 90 tablet 11   No current facility-administered medications for this visit.     PAST MEDICAL HISTORY: Past Medical History:  Diagnosis Date  . Abnormal gait   . Diabetes (HCC)   . Hypercholesteremia   . Hypertension   . Seasonal allergies     PAST SURGICAL HISTORY: Past Surgical History:  Procedure Laterality Date  . APPENDECTOMY    . CHOLECYSTECTOMY    . COLONOSCOPY     normal screening  . HERNIA REPAIR      FAMILY HISTORY: Family History  Problem Relation Age of Onset  . Alzheimer's disease Mother   . Diabetes Mother   . Hypertension Mother   . Other Father        potential complications from hip surgery    SOCIAL HISTORY: Social History   Socioeconomic History  . Marital status: Married    Spouse name: Not on file  . Number of children: 2  . Years of education: 61  . Highest education level: High school graduate  Occupational History  . Occupation: Retired  Chief Executive Officer  Needs  . Financial resource strain: Not on file  . Food insecurity    Worry: Not on file    Inability: Not on file  . Transportation needs    Medical: Not on file    Non-medical: Not on file  Tobacco Use  . Smoking status: Never Smoker  . Smokeless tobacco: Never Used  Substance and Sexual Activity  . Alcohol use: Not Currently  . Drug use: Never  . Sexual activity: Not on file  Lifestyle  . Physical activity    Days per week: Not on file    Minutes per session: Not on file  . Stress: Not on file  Relationships  . Social Herbalist on phone: Not on file    Gets together: Not on file     Attends religious service: Not on file    Active member of club or organization: Not on file    Attends meetings of clubs or organizations: Not on file    Relationship status: Not on file  . Intimate partner violence    Fear of current or ex partner: Not on file    Emotionally abused: Not on file    Physically abused: Not on file    Forced sexual activity: Not on file  Other Topics Concern  . Not on file  Social History Narrative   Lives at home with wife.    Right-handed.   Rare caffeine use.     PHYSICAL EXAM   Vitals:   12/18/18 1233  BP: (!) 155/82  Pulse: 75  Temp: 97.9 F (36.6 C)  Weight: 155 lb (70.3 kg)  Height: 5\' 9"  (1.753 m)    Not recorded      Body mass index is 22.89 kg/m.  PHYSICAL EXAMNIATION:  Gen: NAD, conversant, well nourised, obese, well groomed                     Cardiovascular: Regular rate rhythm, no peripheral edema, warm, nontender. Eyes: Conjunctivae clear without exudates or hemorrhage Neck: Supple, no carotid bruits. Pulmonary: Clear to auscultation bilaterally   NEUROLOGICAL EXAM:  MENTAL STATUS: Speech:    Speech is normal; fluent and spontaneous with normal comprehension.  Cognition:     Orientation to time, place and person     Normal recent and remote memory     Normal Attention span and concentration     Normal Language, naming, repeating,spontaneous speech     Fund of knowledge   CRANIAL NERVES: CN II: Visual fields are full to confrontation.   Pupils are round equal and briskly reactive to light. CN III, IV, VI:   No ptosis.  Smooth pursuit was broken up into small catch-up saccade, mild difficulty with vertical gaze CN V: Facial sensation is intact to pinprick in all 3 divisions bilaterally. Corneal responses are intact.  CN VII: Face is symmetric with normal eye closure and smile. CN VIII: Hearing is normal to rubbing fingers CN IX, X: Palate elevates symmetrically. Phonation is normal. CN XI: Head turning and  shoulder shrug are intact CN XII: Tongue is midline with normal movements and no atrophy.  MOTOR: He has no significant resting tremor, there was no significant weakness, he has rigidity of right upper extremity more than left side, he has difficulty with right wrist opening and closing, difficulty with right foot tapping,  REFLEXES: Reflexes are 2+ and symmetric at the biceps, triceps, knees, and ankles. Plantar responses are flexor.  SENSORY: Intact to  light touch, pinprick, positional sensation and vibratory sensation are intact in fingers and toes.  COORDINATION: Rapid alternating movements and fine finger movements are intact. There is no dysmetria on finger-to-nose and heel-knee-shin.    GAIT/STANCE: He needs to push up to get up from seated position, leaning forward, decreased right arm swing, mild decreased stride   DIAGNOSTIC DATA (LABS, IMAGING, TESTING) - I reviewed patient records, labs, notes, testing and imaging myself where available.   ASSESSMENT AND PLAN  Matthew Graham is a 68 y.o. male   Parkinson's disease  Increase Requip to 2 mg 3 times a day  Encouraged him moderate exercise   Levert FeinsteinYijun Basia Mcginty, M.D. Ph.D.  [facility] [address], [address] [place], [place] [address] Ph: [phone](336) [phone] Fax: [phone]  CC: Referring Provider

## 2018-12-18 NOTE — Telephone Encounter (Signed)
Called Matthew Graham, [facility] and advised him the Rx sent today for requip was correct. I advised Dr Krista Blue didn;t address question about cost of rasagiline or changing medication; I will send back to her. Advised he may not get call until Monday. Matthew Graham  verbalized understanding, appreciation.

## 2018-12-18 NOTE — Telephone Encounter (Signed)
Matt from [facility] called needing clarification on the prescription for the pts rOPINIRole (REQUIP) 2 MG tablet Please call back when available.

## 2018-12-22 NOTE — Telephone Encounter (Signed)
Called pharmacy, spoke with [facility] and informed him that Dr Krista Blue stated to hold off on azilect/selegiline and see how patient responds to higher dose of Requip. He  verbalized understanding, appreciation.

## 2019-01-05 DIAGNOSIS — Z23 Encounter for immunization: Secondary | ICD-10-CM | POA: Diagnosis not present

## 2019-01-06 DIAGNOSIS — Z299 Encounter for prophylactic measures, unspecified: Secondary | ICD-10-CM | POA: Diagnosis not present

## 2019-01-06 DIAGNOSIS — Z713 Dietary counseling and surveillance: Secondary | ICD-10-CM | POA: Diagnosis not present

## 2019-01-06 DIAGNOSIS — E1165 Type 2 diabetes mellitus with hyperglycemia: Secondary | ICD-10-CM | POA: Diagnosis not present

## 2019-01-06 DIAGNOSIS — G20 Parkinson's disease: Secondary | ICD-10-CM | POA: Diagnosis not present

## 2019-01-06 DIAGNOSIS — Z6822 Body mass index (BMI) 22.0-22.9, adult: Secondary | ICD-10-CM | POA: Diagnosis not present

## 2019-01-08 ENCOUNTER — Ambulatory Visit (HOSPITAL_COMMUNITY): Payer: Medicare Other | Attending: Neurology | Admitting: Speech Pathology

## 2019-01-08 ENCOUNTER — Encounter (HOSPITAL_COMMUNITY): Payer: Self-pay | Admitting: Speech Pathology

## 2019-01-08 ENCOUNTER — Other Ambulatory Visit: Payer: Self-pay

## 2019-01-08 DIAGNOSIS — R471 Dysarthria and anarthria: Secondary | ICD-10-CM | POA: Diagnosis not present

## 2019-01-08 NOTE — Therapy (Signed)
[place], [place], [address] Phone: [phone]   Fax:  [phone]  Speech Language Pathology Evaluation  Patient Details  Name: Matthew Graham MRN: [id number] Date of Birth: [birth year] No data recorded  Encounter Date: 01/08/2019  End of Session - 01/08/19 1135    Visit Number  1    Number of Visits  6    Date for SLP Re-Evaluation  02/12/19    Authorization Type  Medicare and [place], covered    SLP Start Time  [phone]    SLP Stop Time   1050    SLP Time Calculation (min)  60 min    Activity Tolerance  Patient tolerated treatment well       Past Medical History:  Diagnosis Date  . Abnormal gait   . Diabetes ([place])   . Hypercholesteremia   . Hypertension   . Seasonal allergies     Past Surgical History:  Procedure Laterality Date  . APPENDECTOMY    . CHOLECYSTECTOMY    . COLONOSCOPY     normal screening  . HERNIA REPAIR      There were no vitals filed for this visit.  Subjective Assessment - 01/08/19 1025    Subjective  "My voice is not as strong as it used to be and I have trouble swallowing water."    Currently in Pain?  No/denies         SLP Evaluation [facility] - 01/08/19 1025      SLP Visit Information   SLP Received On  01/08/19    Medical Diagnosis  Parkinson's disease      General Information   HPI  Matthew Graham is a 68 yo male who was referred by Dr. Marcial Pacas (neurology) for SLE due to recent diagnosis of Parkinson's disease (July 2020). 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 Around early 2020, he was noted to have gradual onset right hand tremor, change of walking posturing, leaning forward, he also noticed mild rigidity of right leg, difficulty making rapid rhythmic movements with his right foot. He does have couple years history of gradually decreased sense of smell, he  denies REM sleep disorder, when he bends down and gets up quickly, he sometime has lightheadedness sensation. He takes Requip 2 mg three times per day. He reports that he had MBSS last February at [facility] due to dysphagia with liquids, but never received therapy.    Behavioral/Cognition  alert and cooperative    Mobility Status  ambulatory      Balance Screen   Has the patient fallen in the past 6 months  Yes    How many times?  1    Has the patient had a decrease in activity level because of a fear of falling?   No    Is the patient reluctant to leave their home because of a fear of falling?   No      Prior Functional Status   Cognitive/Linguistic Baseline  Within functional limits    Type of Home  House     Lives With  Spouse    Available Support  Family    Education  high school    Vocation  Part time employment      Cognition   Overall Cognitive Status  Within Functional Limits for tasks assessed      Auditory Comprehension  Overall Auditory Comprehension  Appears within functional limits for tasks assessed    Yes/No Questions  Within Functional Limits    Commands  Within Functional Limits    Conversation  Complex      Visual Recognition/Discrimination   Discrimination  Within Function Limits      Reading Comprehension   Reading Status  Not tested      Expression   Primary Mode of Expression  Verbal      Verbal Expression   Overall Verbal Expression  Appears within functional limits for tasks assessed    Initiation  No impairment    Automatic Speech  Name;Social Response;Month of year    Level of Generative/Spontaneous Verbalization  Conversation    Repetition  No impairment      Written Expression   Dominant Hand  Right    Written Expression  Not tested      Oral Motor/Sensory Function   Overall Oral Motor/Sensory Function  Impaired    Labial ROM  Within Functional Limits    Labial Symmetry  Within Functional Limits    Labial Strength  Within Functional  Limits    Labial Sensation  Within Functional Limits    Labial Coordination  WFL    Lingual ROM  Within Functional Limits    Lingual Symmetry  Within Functional Limits    Lingual Strength  Reduced    Lingual Sensation  Within Functional Limits    Lingual Coordination  Reduced    Facial ROM  Within Functional Limits    Facial Strength  Within Functional Limits    Facial Sensation  Within Functional Limits    Facial Coordination  WFL    Velum  Within Functional Limits    Mandible  Within Functional Limits      Motor Speech   Overall Motor Speech  Impaired    Respiration  Impaired    Level of Impairment  Sentence    Phonation  Normal   Pt reports reduced volume   Resonance  Within functional limits    Articulation  Within functional limitis    Intelligibility  Intelligible    Motor Planning  Witnin functional limits    Motor Speech Errors  Aware    Effective Techniques  Increased vocal intensity;Over-articulate;Pause   breath support   Phonation  WFL       Outcome Measure  The Voice Handicap Index-10 (VHI-10) was administered. This survey is a series of questions targeting the patient's perception of his/her own voice using a scale of 0-4 (0=Never, 4=Always). Score greater than 11 is abnormal. My voice makes it difficult for people to hear me. 2  People have difficulty understanding me in a noisy room. 2  My voice difficulties restrict personal and social life. 2 I feel left out of conversations because of my voice. 2  My voice problem causes me to lose income. 0  I feel as though I have to strain to produce voice. 0  The clarity of my voice is unpredictable. 2  My voice problem upsets me. 2  My voice makes me feel handicapped. 2  People ask, "What's wrong with your voice?" 2  TOTAL SCORE:  [x]  Abnormal (raw score >11) []  Normal 16/40   EATING ASSESSMENT TOOL (EAT-10)  The patient was asked to rate to what extent the following statements are problematic on a scale of  0-4. 0 = No problem; 4 = Severe problem. A total score of 3 or higher is considered abnormal. My swallowing problem  has caused me to lose weight. 2  My swallowing problem interferes with my ability to go out for meals. 2  Swallowing liquids takes extra effort. 2  Swallowing solids takes extra effort. 0  Swallowing pills takes extra effort. 0 Swallowing is painful. 0  The pleasure of eating is affected by my swallowing. 1  When I swallow food sticks in my throat. 1  I cough when I eat. 2  Swallowing is stressful. 1  TOTAL SCORE: 11  Abnormal (raw score >3)  Normal     Respiration Maximum loudness (sustained phonation): 94 dB SPL  Loudness in connected speech (reading a paragraph): average of 64 dB SPL; decreased loudness Loudness in connected speech (spontaneous speech): average of 62 dB SPL; decreased loudness Maximum phonation duration: 14 seconds; WFL [Minimum: young male: 32 s, young fem: 40 s, elderly male: 71 s, elderly fem: 10 s ([place], [place], [place] 1987)]  Connected speech: reduced loudness ; speaks on residual air, reduced breath support  Resonance Conversational speech: WFL  Articulation  Alternating motion rates (AMR): WFL Sequential motion rates (SMR): WFL Repetition of words/sentences: WFL Connected speech: restricted motion of lips and jaw  Prosody  Speaking rate (reading): 200 words per minute (normal = 160-180 wpm)  Speaking rate (conversation): WFL Intonation: WFL Stress: WFL  Naturalness: WFL Intelligibility: Patient was 100% intelligible in a quiet room with an attentive listener.    90 mL water test [place]essa [place], 2008): fail (coughing after drinking water)     SLP Education - 01/08/19 1134    Education Details  Pt provided with information regarding speech intelligibility and aspiration precautions    Person(s) Educated  Patient    Methods  Explanation;Handout    Comprehension  Need further instruction       SLP Short Term Goals - 01/08/19  1140      SLP SHORT TERM GOAL #1   Title  Pt will verbalize and demonstrate dysarthria treatment strategies as it relates to Parkinson's disease with min assist from SLP    Baseline  Pt with decreased breath support during connected speech, rapid speech    Time  5    Period  Weeks    Status  New    Target Date  02/12/19      SLP SHORT TERM GOAL #2   Title  Pt will implement speech intelligibility strategies during conversational speech (decrease rate, self correct errors, self monitoring, checking to see if listener understands) with min assist.    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 Overall  speech intelligibility is judged to be 100% in quiet environment, however Pt reports avoidance in social environments due to change in voice. Pt was given 3 oz water challenge which he failed due to coughing after trial. Recommend short term SLP therapy to address dysarthria and consider MBSS.   Speech Therapy Frequency  1x /week    Duration  --   5 weeks   Potential to Achieve Goals  Good    SLP Home Exercise Plan  Pt will be independent with HEP as assigned to facilitate carryover of treatment strategies and techniques in home environment    Consulted and Agree with Plan of Care  Patient       Patient will benefit from skilled  therapeutic intervention in order to improve the following deficits and impairments:   Dysarthria and anarthria    Problem List Patient Active Problem List   Diagnosis Date Noted  . 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 Parkinson's disease [facility](HCC) 09/11/2018   Thank you,  Havery MorosDabney Chairty Toman, CCC-SLP [phone]  [facility],Nyah Shepherd 01/08/2019, 12:10 PM  [place] [address] [place], [place], [address] Phone: [phone]   Fax:  [phone]  Name: Grayling CongressKenneth Ronald Cayson MRN: [id number] Date of Birth: 10/26/1950

## 2019-01-12 ENCOUNTER — Ambulatory Visit (HOSPITAL_COMMUNITY): Payer: Medicare Other | Attending: Neurology | Admitting: Speech Pathology

## 2019-01-12 ENCOUNTER — Encounter (HOSPITAL_COMMUNITY): Payer: Self-pay | Admitting: Speech Pathology

## 2019-01-12 ENCOUNTER — Other Ambulatory Visit: Payer: Self-pay

## 2019-01-12 DIAGNOSIS — R1312 Dysphagia, oropharyngeal phase: Secondary | ICD-10-CM | POA: Diagnosis not present

## 2019-01-12 DIAGNOSIS — R471 Dysarthria and anarthria: Secondary | ICD-10-CM

## 2019-01-12 NOTE — Addendum Note (Signed)
Addended by: Marcial Pacas on: 01/12/2019 04:17 PM   Modules accepted: Orders

## 2019-01-12 NOTE — Therapy (Signed)
[facility] [address] [place], [place], [address] Phone: [phone]   Fax:  [phone]  Speech Language Pathology Treatment  Patient Details  Name: Matthew Graham MRN: [id number] Date of Birth: 11/24/1950 No data recorded  Encounter Date: 01/12/2019  End of Session - 01/12/19 1057    Visit Number  2    Number of Visits  6    Date for SLP Re-Evaluation  02/12/19    Authorization Type  Medicare and [employer], covered    SLP Start Time  0945    SLP Stop Time   1031    SLP Time Calculation (min)  46 min    Activity Tolerance  Patient tolerated treatment well       Past Medical History:  Diagnosis Date  . Abnormal gait   . Diabetes ([facility])   . Hypercholesteremia   . Hypertension   . Seasonal allergies     Past Surgical History:  Procedure Laterality Date  . APPENDECTOMY    . CHOLECYSTECTOMY    . COLONOSCOPY     normal screening  . HERNIA REPAIR      There were no vitals filed for this visit.    ADULT SLP TREATMENT - 01/12/19 1039      General Information   Behavior/Cognition  Alert;Cooperative;Pleasant mood    Patient Positioning  Upright in chair    Oral care provided  N/A    HPI  Matthew Graham is a 68 yo male who was referred by Dr. Levert FeinsteinYijun Yan (neurology) for SLE due to recent diagnosis of Parkinson's disease (July 2020). 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 Around early 2020, he was noted to have gradual onset right hand tremor, change of walking posturing, leaning forward, he also noticed mild rigidity of right leg, difficulty making rapid rhythmic movements with his right foot. He does have couple years history of gradually decreased sense of smell, he denies REM sleep disorder, when he bends down and gets up quickly, he sometime has lightheadedness sensation. He takes Requip 2 mg three times per day. He  reports that he had [facility]SS [facility] due to dysphagia with liquids, but never received therapy.       Treatment Provided   Treatment provided  Cognitive-Linquistic;Dysphagia      Dysphagia Treatment   Temperature Spikes Noted  No    Respiratory Status  Room air    Oral Cavity - Dentition  Adequate natural dentition    Treatment Methods  Therapeutic exercise;Compensation strategy training;Patient/caregiver education    Patient observed directly with PO's  No    Type of cueing  Verbal    Amount of cueing  Moderate      Pain Assessment   Pain Assessment  No/denies pain      Cognitive-Linquistic Treatment   Treatment focused on  Dysarthria;Voice;Patient/family/caregiver education    Skilled Treatment  SLP provided skilled dysarthria and dysphagia treatment with implementation of compensatory strategies, breathing exercises, pharyngeal swallow exercises, and sustained production of /a/.     Assessment / Recommendations / Plan   Plan  Continue with current plan of care      Dysphagia Recommendations   Diet recommendations  Regular;Thin liquid    Liquids provided via  Cup    Medication Administration  Whole meds with liquid    Supervision  Patient able to self feed  Compensations  Slow rate;Small sips/bites;Multiple dry swallows after each bite/sip;Follow solids with liquid;Effortful swallow    Postural Changes and/or Swallow Maneuvers  Seated upright 90 degrees;Upright 30-60 min after meal;Out of bed for meals      Progression Toward Goals   Progression toward goals  Progressing toward goals       SLP Education - 01/12/19 1056    Education Details  Pt brought in his previous MBSS from 04/2018; sent swallowing exercises home with Pt    Person(s) Educated  Patient    Methods  Explanation;Handout    Comprehension  Verbalized understanding;Need further instruction       SLP Short Term Goals - 01/12/19 1058      SLP SHORT TERM GOAL #1   Title  Pt will  verbalize and demonstrate dysarthria treatment strategies as it relates to Parkinson's disease with min assist from SLP    Baseline  Pt with decreased breath support during connected speech, rapid speech    Time  5    Period  Weeks    Status  On-going    Target Date  02/12/19      SLP SHORT TERM GOAL #2   Title  Pt will implement speech intelligibility strategies during conversational speech (decrease rate, self correct errors, self monitoring, checking to see if listener understands) with min assist.    Baseline  100% intelligible in quiet 1:1 environment; reduced in noisy environemnt    Time  5    Period  Weeks    Status  On-going    Target Date  02/12/19      SLP SHORT TERM GOAL #3   Title  Pt will complete pharyngeal swallowing exercises as assigned 3x per day with use of written cue    Baseline  Pt brought in copy of MBSS from [facility]; goals established today based off that study    Time  5    Period  Weeks    Status  On-going    Target Date  02/12/19       SLP Long Term Goals - 01/12/19 1059      SLP LONG TERM GOAL #1   Title  Same as short term goals       Plan - 01/12/19 1058    Clinical Impression Statement  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 Pt brought in a copy of his MBSS from February 2020 (reduced pharyngeal squeeze and reduced epiglottic deflection with vallecular residue and trace, silent aspiration of thins, residuals worse with NTL, and chin tuck ineffective; recommended regular textures and thin liquids). Pt was given pharyngeal swallowing exercises which included: CTAR, Mendelsohn, Masako, and effortful swallow which Pt was able to return demonstrate. Pt reminded to focus on swallowing more frequently, think "loud", replenish breath frequently, and to practice sustaining /a/ for goal of 14 seconds. Continue plan of care next session and will add  diagnosis of dysphagia and send back to MD.     Speech Therapy Frequency  1x /week    Duration  --   5 weeks   Potential to Achieve Goals  Good    SLP Home Exercise Plan  Pt will be independent with HEP as assigned to facilitate carryover of treatment strategies and techniques in home environment    Consulted and Agree with Plan of Care  Patient       Patient will benefit from skilled therapeutic intervention in  order to improve the following deficits and impairments:   Dysarthria and anarthria  Dysphagia, oropharyngeal phase    Problem List Patient Active Problem List   Diagnosis Date Noted  . 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 Parkinson's disease [facility]) 09/11/2018   Thank you,  Havery Moros, CCC-SLP [phone]  [facility] 01/12/2019, 12:31 PM  [place] [facility] [address] [place], [place], [address] Phone: [phone]   Fax:  [phone]   Name: Matthew Graham MRN: [id number] Date of Birth: 04/08/1950

## 2019-01-22 ENCOUNTER — Ambulatory Visit (HOSPITAL_COMMUNITY): Payer: Medicare Other | Admitting: Speech Pathology

## 2019-01-22 ENCOUNTER — Encounter (HOSPITAL_COMMUNITY): Payer: Self-pay | Admitting: Speech Pathology

## 2019-01-22 ENCOUNTER — Other Ambulatory Visit: Payer: Self-pay

## 2019-01-22 DIAGNOSIS — R471 Dysarthria and anarthria: Secondary | ICD-10-CM | POA: Diagnosis not present

## 2019-01-22 DIAGNOSIS — R1312 Dysphagia, oropharyngeal phase: Secondary | ICD-10-CM

## 2019-01-22 NOTE — Therapy (Signed)
[place], [place], [address] Phone: [phone]   Fax:  [phone]  Speech Language Pathology Treatment  Patient Details  Name: Matthew Graham MRN: [id number] Date of Birth: [birth year] No data recorded  Encounter Date: 01/22/2019  End of Session - 01/22/19 1056    Visit Number  3    Number of Visits  6    Date for SLP Re-Evaluation  02/12/19    Authorization Type  Medicare and [place], covered    SLP Start Time  [phone]    SLP Stop Time   1032    SLP Time Calculation (min)  45 min    Activity Tolerance  Patient tolerated treatment well       Past Medical History:  Diagnosis Date  . Abnormal gait   . Diabetes ([place])   . Hypercholesteremia   . Hypertension   . Seasonal allergies     Past Surgical History:  Procedure Laterality Date  . APPENDECTOMY    . CHOLECYSTECTOMY    . COLONOSCOPY     normal screening  . HERNIA REPAIR      There were no vitals filed for this visit.  Subjective Assessment - 01/22/19 0959    Subjective  "I have been doing my exercises about 50% of the time."    Currently in Pain?  No/denies            ADULT SLP TREATMENT - 01/22/19 1005      General Information   Behavior/Cognition  Alert;Cooperative;Pleasant mood    Patient Positioning  Upright in chair    Oral care provided  N/A    HPI  Matthew Graham is a 68 yo male who was referred by Dr. Marcial Pacas (neurology) for SLE due to recent diagnosis of Parkinson's disease (July 2020). 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 Around early 2020, he was noted to have gradual onset right hand tremor, change of walking posturing, leaning forward, he also noticed mild rigidity of right leg, difficulty making rapid rhythmic movements with his right foot. He does have couple years history of gradually decreased sense of smell,  he denies REM sleep disorder, when he bends down and gets up quickly, he sometime has lightheadedness sensation. He takes Requip 2 mg three times per day. He reports that he had MBSS last February at [facility] due to dysphagia with liquids, but never received therapy.       Treatment Provided   Treatment provided  Dysphagia;Cognitive-Linquistic      Dysphagia Treatment   Temperature Spikes Noted  No    Respiratory Status  Room air    Oral Cavity - Dentition  Adequate natural dentition    Treatment Methods  Therapeutic exercise;Compensation strategy training;Patient/caregiver education    Patient observed directly with PO's  Yes    Type of PO's observed  Thin liquids    Feeding  Able to feed self    Liquids provided via  Cup    Type of cueing  Verbal    Amount of cueing  Modified independent      Pain Assessment   Pain Assessment  No/denies pain      Cognitive-Linquistic Treatment   Treatment focused on  Dysarthria;Voice;Patient/family/caregiver education    Skilled Treatment  SLP provided skilled dysarthria and dysphagia treatment with implementation of compensatory strategies, breathing exercises, pharyngeal swallow exercises, and sustained production  of /a/.      Assessment / Recommendations / Plan   Plan  All goals met;Discharge SLP treatment due to (comment)      Dysphagia Recommendations   Diet recommendations  Regular;Thin liquid    Liquids provided via  Cup    Medication Administration  Whole meds with liquid    Supervision  Patient able to self feed    Compensations  Slow rate;Small sips/bites;Multiple dry swallows after each bite/sip;Follow solids with liquid;Effortful swallow    Postural Changes and/or Swallow Maneuvers  Seated upright 90 degrees;Upright 30-60 min after meal;Out of bed for meals      Progression Toward Goals   Progression toward goals  Goals met, education completed, patient discharged from [facility] - 01/22/19 1053    Education  Details  All swallowing exercises and voice exercises for Parkinson's reviewed    Person(s) Educated  Patient    Methods  Demonstration    Comprehension  Verbalized understanding;Returned demonstration       SLP Short Term Goals - 01/22/19 1056      SLP SHORT TERM GOAL #1   Title  Pt will verbalize and demonstrate dysarthria treatment strategies as it relates to Parkinson's disease with min assist from SLP    Baseline  Pt with decreased breath support during connected speech, rapid speech    Time  5    Period  Weeks    Status  Achieved    Target Date  02/12/19      SLP SHORT TERM GOAL #2   Title  Pt will implement speech intelligibility strategies during conversational speech (decrease rate, self correct errors, self monitoring, checking to see if listener understands) with min assist.    Baseline  100% intelligible in quiet 1:1 environment; reduced in noisy environemnt    Time  5    Period  Weeks    Status  Achieved    Target Date  02/12/19      SLP SHORT TERM GOAL #3   Title  Pt will complete pharyngeal swallowing exercises as assigned 3x per day with use of written cue    Baseline  Pt brought in copy of MBSS from [facility]; goals established today based off that study    Time  5    Period  Weeks    Status  Achieved    Target Date  02/12/19       SLP Long Term Goals - 01/22/19 1057      SLP LONG TERM GOAL #1   Title  Same as short term goals       Plan - 01/22/19 1056    Clinical Impression Statement  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 Pt was able to return demonstrate pharyngeal swallowing exercises which included: [place], [place], and effortful swallow. SLP wrote a short cut version of exercises on an index card, which appeared easier for Pt to carry out. Pt reminded to focus on swallowing more frequently, think "loud", replenish breath frequently, and  to continue with exercises going forward. He was able to sustain /a/ for 20 seconds over three consecutive trials today (previously 14 seconds). Pt is pleased with his current level of function regarding speech and swallowing and plans to continue with exercises at home with use of written cues. Pt will be discharged from SLP services at this time. He understands that he may require  SLP intervention in the future.    Duration  --   5 weeks   Potential to Achieve Goals  Good    SLP Home Exercise Plan  Pt will be independent with HEP as assigned to facilitate carryover of treatment strategies and techniques in home environment    Consulted and Agree with Plan of Care  Patient       Patient will benefit from skilled therapeutic intervention in order to improve the following deficits and impairments:   Dysarthria and anarthria  Dysphagia, oropharyngeal phase    Problem List Patient Active Problem List   Diagnosis Date Noted  . 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 Parkinson's disease ([place]) 09/11/2018    SPEECH THERAPY DISCHARGE SUMMARY  Visits from Start of Care: 3  Current functional level related to goals / functional outcomes: See above   Remaining deficits: Mild hypokinetic dysarthria and oropharyngeal dysphagia   Education / Equipment: HEP provided and Pt able to return demonstrate. He was advised to continue with HEP going forward.  Plan: Patient agrees to discharge.  Patient goals were met. Patient is being discharged due to meeting the stated rehab goals.  ?????         Thank you,  Genene Churn, [place]  [facility] 01/22/2019, 10:58 AM  [place] [address] [place], [place], [address] Phone: [phone]   Fax:  [phone]   Name: Matthew Graham MRN: [id number] Date of Birth: 07-06-1950

## 2019-01-26 ENCOUNTER — Ambulatory Visit (HOSPITAL_COMMUNITY): Payer: 59 | Admitting: Speech Pathology

## 2019-02-02 ENCOUNTER — Ambulatory Visit (HOSPITAL_COMMUNITY): Payer: 59 | Admitting: Speech Pathology

## 2019-02-09 DIAGNOSIS — L110 Acquired keratosis follicularis: Secondary | ICD-10-CM | POA: Diagnosis not present

## 2019-02-09 DIAGNOSIS — E1140 Type 2 diabetes mellitus with diabetic neuropathy, unspecified: Secondary | ICD-10-CM | POA: Diagnosis not present

## 2019-02-09 DIAGNOSIS — M79672 Pain in left foot: Secondary | ICD-10-CM | POA: Diagnosis not present

## 2019-02-09 DIAGNOSIS — M79671 Pain in right foot: Secondary | ICD-10-CM | POA: Diagnosis not present

## 2019-02-09 DIAGNOSIS — I739 Peripheral vascular disease, unspecified: Secondary | ICD-10-CM | POA: Diagnosis not present

## 2019-02-16 ENCOUNTER — Ambulatory Visit (HOSPITAL_COMMUNITY): Payer: 59 | Admitting: Speech Pathology

## 2019-04-01 DIAGNOSIS — H02102 Unspecified ectropion of right lower eyelid: Secondary | ICD-10-CM | POA: Diagnosis not present

## 2019-04-01 DIAGNOSIS — H5203 Hypermetropia, bilateral: Secondary | ICD-10-CM | POA: Diagnosis not present

## 2019-04-01 DIAGNOSIS — H02105 Unspecified ectropion of left lower eyelid: Secondary | ICD-10-CM | POA: Diagnosis not present

## 2019-04-01 DIAGNOSIS — H52223 Regular astigmatism, bilateral: Secondary | ICD-10-CM | POA: Diagnosis not present

## 2019-04-01 DIAGNOSIS — H524 Presbyopia: Secondary | ICD-10-CM | POA: Diagnosis not present

## 2019-04-01 DIAGNOSIS — E119 Type 2 diabetes mellitus without complications: Secondary | ICD-10-CM | POA: Diagnosis not present

## 2019-04-01 DIAGNOSIS — H2513 Age-related nuclear cataract, bilateral: Secondary | ICD-10-CM | POA: Diagnosis not present

## 2019-04-15 DIAGNOSIS — G20 Parkinson's disease: Secondary | ICD-10-CM | POA: Diagnosis not present

## 2019-04-15 DIAGNOSIS — E1165 Type 2 diabetes mellitus with hyperglycemia: Secondary | ICD-10-CM | POA: Diagnosis not present

## 2019-04-15 DIAGNOSIS — Z299 Encounter for prophylactic measures, unspecified: Secondary | ICD-10-CM | POA: Diagnosis not present

## 2019-04-15 DIAGNOSIS — Z6821 Body mass index (BMI) 21.0-21.9, adult: Secondary | ICD-10-CM | POA: Diagnosis not present

## 2019-04-15 DIAGNOSIS — R634 Abnormal weight loss: Secondary | ICD-10-CM | POA: Diagnosis not present

## 2019-04-15 DIAGNOSIS — I10 Essential (primary) hypertension: Secondary | ICD-10-CM | POA: Diagnosis not present

## 2019-04-16 DIAGNOSIS — R634 Abnormal weight loss: Secondary | ICD-10-CM | POA: Diagnosis not present

## 2019-04-16 DIAGNOSIS — R05 Cough: Secondary | ICD-10-CM | POA: Diagnosis not present

## 2019-04-16 DIAGNOSIS — R079 Chest pain, unspecified: Secondary | ICD-10-CM | POA: Diagnosis not present

## 2019-04-23 DIAGNOSIS — Z23 Encounter for immunization: Secondary | ICD-10-CM | POA: Diagnosis not present

## 2019-04-26 NOTE — Progress Notes (Signed)
Referring Provider: Ignatius Specking, MD Primary Care Physician:  Ignatius Specking, MD Primary Gastroenterologist:  Dr. Jena Gauss  Chief Complaint  Patient presents with  . Weight Loss    trouble swallowing liquids    HPI:   Matthew Graham is a 69 y.o. male presenting today at the request of Dr. Sherril Croon for weight loss, consult colonoscopy and EGD.  History significant for Parkinson's disease.  He has seen speech therapy for hypokinetic dysarthria and dysphagia, oropharyngeal phase.  He has impaired oral motor/sensory function, reduced lingual strength, reduced lingual coordination, impaired motor speech with impaired respiration. At his initial evaluation with [facility], he failed the Yale swallow screen due to coughing after trial. Apparently had MBSS in February 2020 at [facility].  Per review of recent SLP note, the MBSS revealed reduced pharyngeal squeeze and reduced epiglottic deflection with vallecular residue and trace, silent aspiration of thins, residuals worse with NTL, and chin tuck ineffective; recommended regular textures and thin liquids.  He was given pharyngeal swallowing exercises which he was able to demonstrate, reminded to focus on swallowing more frequently, follow solids with liquids, continue regular diet with thin liquids.  He was discharged from SLP services on 01/22/2019 due to meeting goals.   Today:  Last TCS in 2011 in [place]. In office building.  Not sure exactly where or who performed the procedure. Doesn't think he had any polyps. Metformin caused looser bowels. No longer taking Metformin. Now stools are more formed. Occasional constipation. BMs every 2-3 days. Has to strain at times. No blood in the stool. No black stools. No abdominal pain.   Trouble swallowing. Feb 12th 2020, had MBSS. July 2nd saw neurology in [place] and suspected early stage Parkinson's and started on Ropinirole.   Hasn't continued with swallowing exercises. About 50% of the time.  When he was doing exercises regularly, it helped some.   He has no trouble with solid foods. Only trouble with swallowing water. States he will get strangled at times and have to cough. Milk causes no problem. Thicker liquids cause no trouble. Not sure if symptoms have worsened since getting discharged from speech therapy. Symptoms are daily.   Has had some weight loss. Noticed steady down trending weight over the last few years. 4 years ago, he was 200 lbs. Has a good appetite. Not eating any less. Noticed losing 2-3 lbs every time he went for A1C check. States he has actually gained 2 lbs since his last visit with PCP. He does note he is more active with his job. Was retired for about 6 month prior. 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  No nausea, vomiting, or reflux symptoms.  Denies fever, chills, lightheadedness, dizziness, presyncope, or syncope.  Denies chest pain.  Admits to occasional palpitations.  Denies shortness of breath or regular cough other than when drinking liquids.  Had physical within the last 6 months. Had blood work completed. Was told everything looked good.   Past Medical History:  Diagnosis Date  . Abnormal gait   . Diabetes ([facility])   . Hypercholesteremia   . Hypertension   . Seasonal allergies     Past Surgical History:  Procedure Laterality Date  . APPENDECTOMY    . CHOLECYSTECTOMY    . COLONOSCOPY     normal screening  . HERNIA REPAIR      Current Outpatient Medications  Medication Sig Dispense Refill  . amLODipine (NORVASC) 5  MG tablet Take 5 mg by mouth daily.    Marland Kitchen atorvastatin (LIPITOR) 10 MG tablet Take 10 mg by mouth daily.    . irbesartan (AVAPRO) 300 MG tablet Take 1 tablet by mouth daily.    . Polyvinyl Alcohol-Povidone (REFRESH OP) Apply 1 drop to eye as needed.    Marland Kitchen rOPINIRole (REQUIP) 2 MG tablet Take 1 tablet (2 mg total) by mouth 3 (three) times daily. (Patient taking differently: Take 1 mg by mouth  3 (three) times daily. ) 90 tablet 11  . polyethylene glycol-electrolytes (TRILYTE) 420 g solution Take 4,000 mLs by mouth as directed. 4000 mL 0   No current facility-administered medications for this visit.    Allergies as of 04/27/2019 - Review Complete 04/27/2019  Allergen Reaction Noted  . Lisinopril Nausea Only 09/10/2018  . Other  09/10/2018    Family History  Problem Relation Age of Onset  . Alzheimer's disease Mother   . Diabetes Mother   . Hypertension Mother   . Other Father        potential complications from hip surgery  . Colon cancer Neg Hx     Social History   Socioeconomic History  . Marital status: Married    Spouse name: Not on file  . Number of children: 2  . Years of education: 44  . Highest education level: High school graduate  Occupational History  . Occupation: Retired  Tobacco Use  . Smoking status: Never Smoker  . Smokeless tobacco: Never Used  Substance and Sexual Activity  . Alcohol use: Not Currently  . Drug use: Never  . Sexual activity: Not on file  Other Topics Concern  . Not on file  Social History Narrative   Lives at home with wife.    Right-handed.   Rare caffeine use.   Social Determinants of Health   Financial Resource Strain:   . Difficulty of Paying Living Expenses: Not on file  Food Insecurity:   . Worried About Charity fundraiser in the Last Year: Not on file  . Ran Out of Food in the Last Year: Not on file  Transportation Needs:   . Lack of Transportation (Medical): Not on file  . Lack of Transportation (Non-Medical): Not on file  Physical Activity:   . Days of Exercise per Week: Not on file  . Minutes of Exercise per Session: Not on file  Stress:   . Feeling of Stress : Not on file  Social Connections:   . Frequency of Communication with Friends and Family: Not on file  . Frequency of Social Gatherings with Friends and Family: Not on file  . Attends Religious Services: Not on file  . Active Member of Clubs  or Organizations: Not on file  . Attends Archivist Meetings: Not on file  . Marital Status: Not on file  Intimate Partner Violence:   . Fear of Current or Ex-Partner: Not on file  . Emotionally Abused: Not on file  . Physically Abused: Not on file  . Sexually Abused: Not on file    Review of Systems: Gen: See HPI CV: See HPI Resp: See HPI GI: See HPI GU : Increased frequency.  Denies burning or hematuria. MS: Denies joint pain.   Derm: Denies rash.  Psych: Denies depression or anxiety.  Heme: Denies bruising or bleeding.   Physical Exam: BP (!) 159/80   Pulse 78   Temp (!) 97.3 F (36.3 C) (Temporal)   Ht 5\' 9"  (1.753 m)  Wt 150 lb 6.4 oz (68.2 kg)   BMI 22.21 kg/m  General:   Alert and oriented. Pleasant and cooperative. Well-nourished and well-developed.  Head:  Normocephalic and atraumatic. Eyes:  Without icterus, sclera clear and conjunctiva pink.  Ears:  Normal auditory acuity. Lungs:  Clear to auscultation bilaterally. No wheezes, rales, or rhonchi. No distress.  Heart:  S1, S2 present without murmurs appreciated.  Abdomen:  +BS, soft, non-tender and non-distended. No HSM noted. No guarding or rebound. No masses appreciated.  Rectal:  Deferred  Msk:  Symmetrical without gross deformities.  Extremities:  Without edema. Neurologic:  Alert and  oriented x4;  grossly normal neurologically. Skin:  Intact without significant lesions or rashes. Psych: Normal mood and affect.

## 2019-04-27 ENCOUNTER — Other Ambulatory Visit: Payer: Self-pay

## 2019-04-27 ENCOUNTER — Ambulatory Visit: Payer: 59 | Admitting: Gastroenterology

## 2019-04-27 ENCOUNTER — Encounter: Payer: Self-pay | Admitting: Gastroenterology

## 2019-04-27 ENCOUNTER — Ambulatory Visit (INDEPENDENT_AMBULATORY_CARE_PROVIDER_SITE_OTHER): Payer: Medicare Other | Admitting: Gastroenterology

## 2019-04-27 DIAGNOSIS — I739 Peripheral vascular disease, unspecified: Secondary | ICD-10-CM | POA: Diagnosis not present

## 2019-04-27 DIAGNOSIS — R1310 Dysphagia, unspecified: Secondary | ICD-10-CM | POA: Insufficient documentation

## 2019-04-27 DIAGNOSIS — K5900 Constipation, unspecified: Secondary | ICD-10-CM | POA: Insufficient documentation

## 2019-04-27 DIAGNOSIS — Z1211 Encounter for screening for malignant neoplasm of colon: Secondary | ICD-10-CM | POA: Diagnosis not present

## 2019-04-27 DIAGNOSIS — M79671 Pain in right foot: Secondary | ICD-10-CM | POA: Diagnosis not present

## 2019-04-27 DIAGNOSIS — R1312 Dysphagia, oropharyngeal phase: Secondary | ICD-10-CM | POA: Diagnosis not present

## 2019-04-27 DIAGNOSIS — L110 Acquired keratosis follicularis: Secondary | ICD-10-CM | POA: Diagnosis not present

## 2019-04-27 DIAGNOSIS — M79672 Pain in left foot: Secondary | ICD-10-CM | POA: Diagnosis not present

## 2019-04-27 DIAGNOSIS — E1140 Type 2 diabetes mellitus with diabetic neuropathy, unspecified: Secondary | ICD-10-CM | POA: Diagnosis not present

## 2019-04-27 MED ORDER — PEG 3350-KCL-NA BICARB-NACL 420 G PO SOLR: 4000.0000 mL | ORAL | 0 refills | Status: DC

## 2019-04-27 NOTE — Assessment & Plan Note (Addendum)
Mild constipation since discontinuing Metformin. BMs every 2-3 days with straining at times. No bright red blood per rectum or melena.  He does note slow steady weight loss over the last few years.  200 pounds 4 years ago.  States he was losing 2 to 3 pounds every time he went for his A1c check.  He does note today he is up 2 pounds from his last visit with PCP.  Last colonoscopy in 2011 in [place].  Per patient, no polyps.  He is due for repeat at this time.  Add Benefiber or Metamucil daily. Add MiraLAX 1 capful (17 g) in 8 ounces of water daily if Benefiber or Metamucil does not produce BMs daily to every other day.  May decrease frequency if loose stools develop. Proceed with TCS for colon cancer screening with Dr. Jena Gauss in the near future. The risks, benefits, and alternatives have been discussed in detail with patient. They have stated understanding and desire to proceed.  Follow-up after colonoscopy.

## 2019-04-27 NOTE — Assessment & Plan Note (Addendum)
Known oropharyngeal dysphagia.  Patient has seen speech therapy in the past. MBSS in February 2020 with reduced pharyngeal squeeze and reduced epiglottic deflection with vallecular residue and trace, silent aspiration of thin's, residuals worse with normal thin liquids and chin tuck ineffective.  Most recently saw speech therapy at [facility] in October and November 2020.  He failed Yale swallow screen due to coughing after trial.  He was giving swallowing exercises and speech felt he was doing well and discharged him.  Stated he may need therapy again in the future.  Currently, he is having trouble swallowing thin liquids daily with frequent coughing.  The thicker the liquids, the less trouble he has.  No food dysphagia.  No GERD symptoms or other upper GI symptoms.   As dysphagia is only to thin liquids, I do not feel EGD is appropriate at this time.  Patient needs to follow-up with speech therapy for further recommendations regarding possible liquid modification moving forward.  Suspect Parkinson's disease is playing a role. We will replace referral back to speech therapy.

## 2019-04-27 NOTE — Patient Instructions (Signed)
We will get you scheduled for an colonoscopy in the near future with Dr. Jena Gauss.  We will refer you back to speech therapy for trouble with swallowing liquids.   Add benefiber or metamucil daily to help with bowel regularity.   If the fiber supplement doesn't help produce bowel movements daily to every other day, use MiraLAX 1 capful in 8 oz of water daily. You can decrease frequency if you develop frequent loose stools.   We will follow-up with you after your colonoscopy. Call if questions or concerns prior.   Ermalinda Memos, PA-C [facility]

## 2019-04-27 NOTE — Assessment & Plan Note (Signed)
69 year old male with history of early Parkinson's disease, diabetes (no longer on medications), HTN, HLD presenting at request of PCP for consult colonoscopy.  Patient reports last colonoscopy in 2011 in [place] in a doctor's office.  Not sure exactly where or who performed this but does not think he had colon polyps.  Currently dealing with mild constipation since discontinuing Metformin.  Otherwise, no significant lower GI symptoms.  He does note steady weight loss over the last few years without any significant change in appetite or diet.  States that his last check with PCP, he weighed 148 pounds, today he weighs 150 pounds.  Notes this is the first increase in weight he has had no long time.  4 years ago, he weighed 200 pounds.  Denies bright red blood per rectum, melena.  No family history of colon cancer.  Proceed with TCS with Dr. Jena Gauss in the near future. The risks, benefits, and alternatives have been discussed in detail with patient. They have stated understanding and desire to proceed.  Add Benefiber or Metamucil daily. Add MiraLAX 1 capful (17 g) in 8 ounces of water daily if Benefiber or Metamucil does not produce BMs daily to every other day. Follow-up after colonoscopy.  Consider CT abdomen and pelvis in the future if weight continues to trend down and no significant findings on colonoscopy.

## 2019-04-28 ENCOUNTER — Telehealth: Payer: Self-pay | Admitting: Internal Medicine

## 2019-04-28 NOTE — Telephone Encounter (Signed)
Miralax instructions mailed. Tried to call pt, no answer, LMOVM to inform him.

## 2019-04-28 NOTE — Telephone Encounter (Signed)
[facility] said they can not get the patient prep in   Needs another kind

## 2019-05-04 ENCOUNTER — Ambulatory Visit (HOSPITAL_COMMUNITY): Payer: Medicare Other | Attending: Gastroenterology | Admitting: Speech Pathology

## 2019-05-04 ENCOUNTER — Other Ambulatory Visit: Payer: Self-pay

## 2019-05-04 ENCOUNTER — Encounter (HOSPITAL_COMMUNITY): Payer: Self-pay | Admitting: Speech Pathology

## 2019-05-04 DIAGNOSIS — R1312 Dysphagia, oropharyngeal phase: Secondary | ICD-10-CM | POA: Diagnosis not present

## 2019-05-04 NOTE — Therapy (Signed)
[place], [place], [address] Phone: [phone]   Fax:  [phone]  Speech Language Pathology Evaluation/Clinical Swallow Evaluation  Patient Details  Name: Matthew Graham MRN: [id number] Date of Birth: 1950/09/29 No data recorded  Encounter Date: 05/04/2019  End of Session - 05/04/19 1338    Visit Number  1    Number of Visits  1    Authorization Type  Medicare    SLP Start Time  1740    SLP Stop Time   1120    SLP Time Calculation (min)  45 min    Activity Tolerance  Patient tolerated treatment well       Past Medical History:  Diagnosis Date  . Abnormal gait   . Diabetes ([place])   . Hypercholesteremia   . Hypertension   . Seasonal allergies     Past Surgical History:  Procedure Laterality Date  . APPENDECTOMY    . CHOLECYSTECTOMY    . COLONOSCOPY     normal screening  . HERNIA REPAIR      There were no vitals filed for this visit.  Subjective Assessment - 05/04/19 1330    Subjective  "I think I was sent to the principal's office because I haven't been doing my exercises.."    Currently in Pain?  No/denies        Prior Functional Status - 05/04/19 1331      Prior Functional Status   Cognitive/Linguistic Baseline  Within functional limits    Type of Home  House     Lives With  Spouse    Available Help at Discharge  Family    Vocation  Part time employment      General - 05/04/19 1331      General Information   Date of Onset  04/27/19    HPI  Caroline Matters is a 69 yo male who was referred by Aliene Altes, PA-C (GI) for a clinical swallow evaluation. Dr. Marcial Pacas (neurology) for SLE due to recent diagnosis of Parkinson's disease (July 2020). 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 Around early 2020, he was noted to have gradual onset right hand tremor, change of walking posturing,  leaning forward, he also noticed mild rigidity of right leg, difficulty making rapid rhythmic movements with his right foot. He does have couple years history of gradually decreased sense of smell, he denies REM sleep disorder, when he bends down and gets up quickly, he sometime has lightheadedness sensation. He takes Requip 2 mg three times per day. He reports that he had MBSS last February at [facility] due to dysphagia with liquids, but never received therapy.    Type of Study  Bedside Swallow Evaluation    Previous Swallow Assessment  MBSS Feb 2020 at [facility]    Diet Prior to this Study  Regular;Thin liquids    Temperature Spikes Noted  No    Respiratory Status  Room air    History of Recent Intubation  No    Behavior/Cognition  Alert;Cooperative;Pleasant mood    Oral Cavity Assessment  Within Functional Limits    Oral Care Completed by SLP  No    Oral Cavity - Dentition  Adequate natural dentition    Vision  Functional for self-feeding    Self-Feeding Abilities  Able to feed self    Patient Positioning  Upright in chair    Baseline  Vocal Quality  Normal    Volitional Cough  Strong    Volitional Swallow  Able to elicit       Oral Motor/Sensory Function - 05/04/19 1331      Oral Motor/Sensory Function   Overall Oral Motor/Sensory Function  Mild impairment      Ice Chips - 05/04/19 1332      Ice Chips   Ice chips  Not tested      Thin Liquid - 05/04/19 1332      Thin Liquid   Thin Liquid  Impaired    Presentation  Cup;Self Fed;Straw    Pharyngeal  Phase Impairments  Suspected delayed Swallow;Multiple swallows;Cough - Immediate      Nectar thick liquid - 05/04/19 1332      Nectar Thick Liquid   Nectar Thick Liquid  Within functional limits    Presentation  Cup;Self Fed      Honey Thick Liquid - 05/04/19 1332      Honey Thick Liquid   Honey Thick Liquid  Not tested      Puree - 05/04/19 1332      Puree   Puree  Within functional limits     Presentation  Spoon;Self Fed      Solid - 05/04/19 1332      Solid   Solid  Within functional limits    Presentation  Self Fed    Other Comments  delayed cough x1      Outcome Measure  The Voice Handicap Index-10 (VHI-10) was administered. This survey is a series of questions targeting the patient's perception of his/her own voice using a scale of 0-4 (0=Never, 4=Always). Score greater than 11 is abnormal. My voice makes it difficult for people to hear me. 2  People have difficulty understanding me in a noisy room. 2  My voice difficulties restrict personal and social life. 2 I feel left out of conversations because of my voice. 1  My voice problem causes me to lose income. 0  I feel as though I have to strain to produce voice. 0  The clarity of my voice is unpredictable. 1  My voice problem upsets me. 2  My voice makes me feel handicapped. 2  People ask, "What's wrong with your voice?" 1  TOTAL SCORE:  [x]  Abnormal (raw score >11) []  Normal 13/40   EATING ASSESSMENT TOOL (EAT-10)  The patient was asked to rate to what extent the following statements are problematic on a scale of 0-4. 0 = No problem; 4 = Severe problem. A total score of 3 or higher is considered abnormal. My swallowing problem has caused me to lose weight. 2  My swallowing problem interferes with my ability to go out for meals. 3  Swallowing liquids takes extra effort. 2  Swallowing solids takes extra effort. 1  Swallowing pills takes extra effort. 0 Swallowing is painful. 0  The pleasure of eating is affected by my swallowing. 1  When I swallow food sticks in my throat. 1  I cough when I eat. 3  Swallowing is stressful. 1  TOTAL SCORE: 14 [x]  Abnormal (raw score >3) []  Normal    (Previously was 11)  SLP Education - 05/04/19 1330    Education Details  Plan for MBSS    Person(s) Educated  Patient    Methods  Explanation    Comprehension  Verbalized understanding       SLP Short Term Goals -  05/04/19 1340      SLP SHORT  TERM GOAL #1   Title  Complete MBSS         Plan - 05/04/19 1339    Clinical Impression Statement  Pt presents with signs of reduced airway protection characterized by coughing after thin liquids. Pt's EAT-10 score above demonstrates increased perception of dysphagia as well (increased from 11 to 14). Pt indicates that he has not been completing pharyngeal swallowing exercises consistently and he was encouraged to start doing that. Given weight loss and increased signs of reduced airway protection, recommend MBSS to help establish plan of care (last was completed a year ago at [facility]). SLP will request order for MBSS from referring physician's office (Rourk and Ermalinda Memos). Recommend regular textures and thin liquids until MBSS.    Treatment/Interventions  Other (comment)   MBSS   Consulted and Agree with Plan of Care  Patient       Patient will benefit from skilled therapeutic intervention in order to improve the following deficits and impairments:   Dysphagia, oropharyngeal phase    Problem List Patient Active Problem List   Diagnosis Date Noted  . Dysphagia 04/27/2019  . Constipation 04/27/2019  . Colon cancer screening 04/27/2019  . 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 Parkinson's disease [facility]) 09/11/2018   Thank you,  Havery Moros, CCC-SLP [phone]  [facility] 05/04/2019, 1:46 PM  [facility] [address] [place], [place], [address] Phone: [phone]   Fax:  [phone]  Name: Garey Alleva MRN: [id number] Date of Birth: [birth year]

## 2019-05-12 ENCOUNTER — Telehealth (HOSPITAL_COMMUNITY): Payer: Self-pay | Admitting: Speech Pathology

## 2019-05-12 NOTE — Telephone Encounter (Signed)
Pt called and informed that Matthew Graham has contacted Dr. Loran Graham office for Matthew Graham referral. Pt request Matthew Graham call him

## 2019-05-13 ENCOUNTER — Other Ambulatory Visit (HOSPITAL_COMMUNITY): Payer: Self-pay | Admitting: Specialist

## 2019-05-13 ENCOUNTER — Telehealth: Payer: Self-pay | Admitting: *Deleted

## 2019-05-13 DIAGNOSIS — R1312 Dysphagia, oropharyngeal phase: Secondary | ICD-10-CM

## 2019-05-13 NOTE — Progress Notes (Signed)
[facility] Clinical Pool: SLP is requesting we place an order for MBSS to be completed at [facility] for dysphagia. Please arrange.

## 2019-05-13 NOTE — Telephone Encounter (Signed)
-----   Message from Letta Median, [place] sent at 05/13/2019  8:23 AM EST ----- Regarding: FW: MBSS   ----- Message ----- From: Dorene Ar, CCC-SLP Sent: 05/12/2019   1:36 PM EST To: Letta Median, PA-C Subject: RE: MBSS                                       Thank you for signing the certification. Radiology is sometimes picky about it needing to come from a specific order. Can you please place a referral/order for MBSS to be completed at [facility]? Thank you, Dabney ----- Message ----- From: Hal Neer Sent: 05/11/2019   4:11 PM EST To: Dorene Ar, CCC-SLP Subject: MBSS                                           I have co-signed the order for MBSS electronically. Please let me know if the order appears signed on your end. If not, we can place a new order.   Thanks,  Ermalinda Memos, PA-C [facility] ----- Message ----- From: Dorene Ar, CCC-SLP Sent: 05/11/2019   1:44 PM EST To: Corbin Ade, MD, Letta Median, PA-C  To: Ermalinda Memos, PA-C CC: Dr. Jena Gauss Re: BSE for [place]  I would like to complete MBSS on him. Can you please place an order for MBSS/DG swallow function to be completed at [facility] and we can get it arranged? Thank you,  Havery Moros, CCC-SLP [phone]

## 2019-05-13 NOTE — Telephone Encounter (Signed)
Order placed

## 2019-05-18 ENCOUNTER — Ambulatory Visit (HOSPITAL_COMMUNITY): Payer: Medicare Other | Attending: Gastroenterology | Admitting: Speech Pathology

## 2019-05-18 ENCOUNTER — Ambulatory Visit (HOSPITAL_COMMUNITY)
Admission: RE | Admit: 2019-05-18 | Discharge: 2019-05-18 | Disposition: A | Discharge status: Home / Self Care | Payer: Medicare Other | Source: Ambulatory Visit | Attending: Gastroenterology | Admitting: Gastroenterology

## 2019-05-18 ENCOUNTER — Other Ambulatory Visit: Payer: Self-pay

## 2019-05-18 ENCOUNTER — Encounter (HOSPITAL_COMMUNITY): Payer: Self-pay | Admitting: Speech Pathology

## 2019-05-18 DIAGNOSIS — R1312 Dysphagia, oropharyngeal phase: Secondary | ICD-10-CM

## 2019-05-18 NOTE — Therapy (Signed)
[facility] [address] [place], [place], [address] Phone: [phone]   Fax:  [phone]  Modified Barium Swallow  Patient Details  Name: Matthew Graham MRN: [id number] Date of Birth: 1950/10/30 No data recorded  Encounter Date: 05/18/2019  End of Session - 05/18/19 1533    Visit Number  1    Number of Visits  1    Authorization Type  Medicare    SLP Start Time  1134    SLP Stop Time   1200    SLP Time Calculation (min)  26 min    Activity Tolerance  Patient tolerated treatment well       Past Medical History:  Diagnosis Date  . Abnormal gait   . Diabetes ([facility])   . Hypercholesteremia   . Hypertension   . Seasonal allergies     Past Surgical History:  Procedure Laterality Date  . APPENDECTOMY    . CHOLECYSTECTOMY    . COLONOSCOPY     normal screening  . HERNIA REPAIR      There were no vitals filed for this visit.  Subjective Assessment - 05/18/19 1529    Subjective  "I got choked on a piece of meat this weekend."    Special Tests  MBSS    Currently in Pain?  No/denies        General - 05/18/19 1529      General Information   Date of Onset  04/27/19    HPI  Matthew Graham is a 69 yo male who was referred by Ermalinda Memos, PA-C (GI) for a clinical swallow evaluation. Dr. Levert Feinstein (neurology) for SLE due to recent diagnosis of Parkinson's disease (July 2020). 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 Around early 2020, he was noted to have gradual onset right hand tremor, change of walking posturing, leaning forward, he also noticed mild rigidity of right leg, difficulty making rapid rhythmic movements with his right foot. He does have couple years history of gradually decreased sense of smell, he denies REM sleep disorder, when he bends down and gets up quickly, he sometime has lightheadedness sensation. He takes  Requip 2 mg three times per day. He reports that he had MBSS last February at [facility] due to dysphagia with liquids, but never received therapy.    Type of Study  MBS-Modified Barium Swallow Study    Previous Swallow Assessment  MBSS Feb 2020 at [facility]    Diet Prior to this Study  Regular;Thin liquids    Temperature Spikes Noted  No    Respiratory Status  Room air    History of Recent Intubation  No    Behavior/Cognition  Alert;Cooperative;Pleasant mood    Oral Cavity - Dentition  Adequate natural dentition    Self-Feeding Abilities  Able to feed self    Patient Positioning  Upright in chair    Baseline Vocal Quality  Normal    Volitional Cough  Strong    Volitional Swallow  Able to elicit    Anatomy  Within functional limits    Pharyngeal Secretions  Not observed secondary MBS         Oral Preparation/Oral Phase - 05/18/19 1530      Oral Preparation/Oral Phase   Oral Phase  Within functional limits      Electrical stimulation - Oral Phase   Was Electrical Stimulation Used  No  Pharyngeal Phase - 05/18/19 1530      Pharyngeal Phase   Pharyngeal Phase  Impaired      Pharyngeal - Nectar   Pharyngeal- Nectar Cup  Swallow initiation at vallecula;Reduced epiglottic inversion;Reduced airway/laryngeal closure;Reduced tongue base retraction;Penetration/Aspiration during swallow;Pharyngeal residue - valleculae;Pharyngeal residue - pyriform    Pharyngeal  Material enters airway, remains ABOVE vocal cords and not ejected out      Pharyngeal - Thin   Pharyngeal- Thin Teaspoon  Swallow initiation at vallecula;Reduced pharyngeal peristalsis;Reduced epiglottic inversion;Reduced airway/laryngeal closure;Reduced tongue base retraction;Penetration/Aspiration during swallow;Trace aspiration;Penetration/Apiration after swallow;Pharyngeal residue - valleculae;Pharyngeal residue - pyriform    Pharyngeal  Material enters airway, passes BELOW cords and not ejected out despite  cough attempt by patient   penetration during, aspiration after, delayed cough after aspiration   Pharyngeal- Thin Cup  Swallow initiation at vallecula;Reduced epiglottic inversion;Reduced airway/laryngeal closure;Reduced tongue base retraction;Penetration/Aspiration during swallow;Trace aspiration;Pharyngeal residue - valleculae;Pharyngeal residue - pyriform    Pharyngeal  Material enters airway, passes BELOW cords then ejected out   aspirated thin when taking pill     Pharyngeal - Solids   Pharyngeal- Puree  Swallow initiation at vallecula;Reduced pharyngeal peristalsis;Reduced epiglottic inversion;Reduced tongue base retraction;Penetration/Aspiration during swallow;Penetration/Apiration after swallow;Moderate aspiration;Pharyngeal residue - valleculae;Pharyngeal residue - pyriform;Pharyngeal residue - posterior pharnyx;Compensatory strategies attempted (with notebox)   head turns, chin tuck   Pharyngeal  Material enters airway, passes BELOW cords then ejected out    Pharyngeal- Regular  Reduced epiglottic inversion;Reduced tongue base retraction;Pharyngeal residue - valleculae;Pharyngeal residue - pyriform    Pharyngeal- Pill  Trace aspiration   aspirated trace thin when taking pill with cup thin     Electrical Stimulation - Pharyngeal Phase   Was Electrical Stimulation Used  No       Cricopharyngeal Phase - 05/18/19 1532      Cervical Esophageal Phase   Cervical Esophageal Phase  Within functional limits           SLP Short Term Goals - 05/04/19 1340      SLP SHORT TERM GOAL #1   Title  Complete MBSS        MBSS from February 2020 at [facility]:  (reduced pharyngeal squeeze and reduced epiglottic deflection with vallecular residue and trace, silent aspiration of thins, residuals worse with NTL, and chin tuck ineffective; recommended regular textures and thin liquids).  Plan - 05/18/19 1534    Clinical Impression Statement MBSS completed. Pt presents with moderate pharyngeal  phase dysphagia characterized by reduced tongue base retraction, epiglottic deflection, and laryngeal vestibule closure resulting in variable penetration and aspiration of thins, NTL, and puree and pharyngeal residuals post swallow. Pt had one episode of gross aspiration of puree textures which he removed with a cough. Aspiration was variably sensed and usually delayed. Head turn L/R was trialed and not found to have consistent effective results. A chin tuck appeared to be most effective in increasing posterior pharyngeal wall to epiglottic approximation (slightly facilitated increased epiglottic deflection). Pt appears to present with similar results from MBSS completed at [facility] last February however difficult to determine without seeing the imaging. Pt is at high risk for aspiration given his above stated deficits. Recommend self regulated regular textures (mech soft meats) and thin liquids with use of strategies (chin tuck, cough, repeat swallows, alternate solids/liquids), oral care, SLP therapy for consideration of SPEAK OUT! Techniques and dysphagia therapy. Also recommend ENT consult to evaluated larynx due to suspected decreased vocal fold closure.    Speech Therapy Frequency  --  pending MBSS   Treatment/Interventions  Other (comment)   MBSS   Consulted and Agree with Plan of Care  Patient       Patient will benefit from skilled therapeutic intervention in order to improve the following deficits and impairments:   Dysphagia, oropharyngeal phase    Recommendations/Treatment - 05/18/19 1532      Swallow Evaluation Recommendations   Recommended Consults  Consider ENT evaluation   to evaluate vocal fold function   SLP Diet Recommendations  Thin;Age appropriate regular    Liquid Administration via  Cup    Medication Administration  Whole meds with liquid    Supervision  Patient able to self feed    Compensations  Multiple dry swallows after each bite/sip;Follow solids with liquid;Clear  throat intermittently    Postural Changes  Seated upright at 90 degrees;Remain upright for at least 30 minutes after feeds/meals       Prognosis - 05/18/19 1533      Prognosis   Prognosis for Safe Diet Advancement  Fair    Barriers to Reach Goals  Severity of deficits      Individuals Consulted   Consulted and Agree with Results and Recommendations  Patient    Report Sent to   Referring physician       Problem List Patient Active Problem List   Diagnosis Date Noted  . Dysphagia 04/27/2019  . Constipation 04/27/2019  . Colon cancer screening 04/27/2019  . 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 Parkinson's disease [facility]) 09/11/2018   Thank you,  Havery Moros, CCC-SLP [phone]   [facility] 05/19/2019, 12:24 PM  [facility] [address] [place], [place], [address] Phone: [phone]   Fax:  [phone]  Name: Matthew Graham MRN: [id number] Date of Birth: 1951/01/06

## 2019-05-20 NOTE — Progress Notes (Signed)
Spoke with Matthew Graham, CCC-SLP. States we will need to place ENT referral. They will discuss with Matthew Graham to determine if he is on board with further therapy. If so, they will send a re-cert form for me to cosign.   [facility] Clinical Pool: Please let patient know SLP has recommended ENT referral to evaluate his vocal cords. Please place referral if he is agreeable. Dx: Dysarthria, dysphagia with gross aspiration of puree textures on MBSS.

## 2019-05-20 NOTE — Progress Notes (Signed)
Reviewed MBSS. Significant pharyngeal dysphagia. See Speech Therapy note. Recommendations for additional therapy if motivated. Also felt he would benefit from SPEAK OUT!, a parkinson's program for dysarthria. Also recommended ENT consult for evaluation of vocal cord function as he has gross aspiration of puree textures.   I am discussing further with speech therapy to determine if I need to help facilitate therapy or ENT referral.

## 2019-05-21 ENCOUNTER — Other Ambulatory Visit: Payer: Self-pay | Admitting: *Deleted

## 2019-05-21 ENCOUNTER — Telehealth: Payer: Self-pay | Admitting: Internal Medicine

## 2019-05-21 DIAGNOSIS — Z23 Encounter for immunization: Secondary | ICD-10-CM | POA: Diagnosis not present

## 2019-05-21 DIAGNOSIS — R471 Dysarthria and anarthria: Secondary | ICD-10-CM

## 2019-05-21 NOTE — Telephone Encounter (Signed)
See result note.  

## 2019-05-21 NOTE — Telephone Encounter (Signed)
Pt was returning a call from yesterday. [phone]

## 2019-05-25 ENCOUNTER — Other Ambulatory Visit (HOSPITAL_COMMUNITY)
Admission: RE | Admit: 2019-05-25 | Discharge: 2019-05-25 | Disposition: A | Discharge status: Home / Self Care | Payer: Medicare Other | Source: Ambulatory Visit | Attending: Internal Medicine | Admitting: Internal Medicine

## 2019-05-25 ENCOUNTER — Other Ambulatory Visit: Payer: Self-pay

## 2019-05-25 DIAGNOSIS — Z01812 Encounter for preprocedural laboratory examination: Secondary | ICD-10-CM | POA: Diagnosis not present

## 2019-05-25 DIAGNOSIS — Z20822 Contact with and (suspected) exposure to covid-19: Secondary | ICD-10-CM | POA: Diagnosis not present

## 2019-05-25 LAB — SARS CORONAVIRUS 2 (TAT 6-24 HRS): SARS Coronavirus 2: NEGATIVE

## 2019-05-27 ENCOUNTER — Other Ambulatory Visit: Payer: Self-pay

## 2019-05-27 ENCOUNTER — Ambulatory Visit (HOSPITAL_COMMUNITY)
Admission: RE | Admit: 2019-05-27 | Discharge: 2019-05-27 | Disposition: A | Discharge status: Home / Self Care | Payer: Medicare Other | Source: Ambulatory Visit | Attending: Internal Medicine | Admitting: Internal Medicine

## 2019-05-27 ENCOUNTER — Encounter (HOSPITAL_COMMUNITY)
Admission: RE | Disposition: A | Discharge status: Home / Self Care | Payer: Self-pay | Source: Ambulatory Visit | Attending: Internal Medicine

## 2019-05-27 ENCOUNTER — Encounter (HOSPITAL_COMMUNITY): Payer: Self-pay | Admitting: Internal Medicine

## 2019-05-27 DIAGNOSIS — E7800 Pure hypercholesterolemia, unspecified: Secondary | ICD-10-CM | POA: Insufficient documentation

## 2019-05-27 DIAGNOSIS — Z8249 Family history of ischemic heart disease and other diseases of the circulatory system: Secondary | ICD-10-CM | POA: Insufficient documentation

## 2019-05-27 DIAGNOSIS — E119 Type 2 diabetes mellitus without complications: Secondary | ICD-10-CM | POA: Diagnosis not present

## 2019-05-27 DIAGNOSIS — K5730 Diverticulosis of large intestine without perforation or abscess without bleeding: Secondary | ICD-10-CM | POA: Insufficient documentation

## 2019-05-27 DIAGNOSIS — Z79899 Other long term (current) drug therapy: Secondary | ICD-10-CM | POA: Insufficient documentation

## 2019-05-27 DIAGNOSIS — Z888 Allergy status to other drugs, medicaments and biological substances status: Secondary | ICD-10-CM | POA: Insufficient documentation

## 2019-05-27 DIAGNOSIS — R269 Unspecified abnormalities of gait and mobility: Secondary | ICD-10-CM | POA: Insufficient documentation

## 2019-05-27 DIAGNOSIS — Z820 Family history of epilepsy and other diseases of the nervous system: Secondary | ICD-10-CM | POA: Diagnosis not present

## 2019-05-27 DIAGNOSIS — Z1211 Encounter for screening for malignant neoplasm of colon: Secondary | ICD-10-CM | POA: Diagnosis not present

## 2019-05-27 DIAGNOSIS — I10 Essential (primary) hypertension: Secondary | ICD-10-CM | POA: Insufficient documentation

## 2019-05-27 DIAGNOSIS — Z833 Family history of diabetes mellitus: Secondary | ICD-10-CM | POA: Diagnosis not present

## 2019-05-27 DIAGNOSIS — Z9049 Acquired absence of other specified parts of digestive tract: Secondary | ICD-10-CM | POA: Diagnosis not present

## 2019-05-27 HISTORY — PX: COLONOSCOPY: SHX5424

## 2019-05-27 LAB — GLUCOSE, CAPILLARY: Glucose-Capillary: 138 mg/dL — ABNORMAL HIGH (ref 70–99)

## 2019-05-27 SURGERY — COLONOSCOPY
Anesthesia: Moderate Sedation

## 2019-05-27 MED ORDER — ONDANSETRON HCL 4 MG/2ML IJ SOLN
INTRAMUSCULAR | Status: AC
  Filled 2019-05-27: qty 2

## 2019-05-27 MED ORDER — MIDAZOLAM HCL 5 MG/5ML IJ SOLN
INTRAMUSCULAR | Status: AC
  Filled 2019-05-27: qty 10

## 2019-05-27 MED ORDER — MEPERIDINE HCL 50 MG/ML IJ SOLN
INTRAMUSCULAR | Status: AC
  Filled 2019-05-27: qty 1

## 2019-05-27 MED ORDER — MEPERIDINE HCL 100 MG/ML IJ SOLN
INTRAMUSCULAR | Status: DC | PRN
  Administered 2019-05-27: 15 mg via INTRAVENOUS
  Administered 2019-05-27: 25 mg via INTRAVENOUS

## 2019-05-27 MED ORDER — SODIUM CHLORIDE 0.9 % IV SOLN: INTRAVENOUS | Status: DC

## 2019-05-27 MED ORDER — MIDAZOLAM HCL 5 MG/5ML IJ SOLN
INTRAMUSCULAR | Status: DC | PRN
  Administered 2019-05-27: 2 mg via INTRAVENOUS
  Administered 2019-05-27: 1 mg via INTRAVENOUS
  Administered 2019-05-27: 2 mg via INTRAVENOUS

## 2019-05-27 MED ORDER — STERILE WATER FOR IRRIGATION IR SOLN
Status: DC | PRN
  Administered 2019-05-27: 100 mL

## 2019-05-27 MED ORDER — ONDANSETRON HCL 4 MG/2ML IJ SOLN
INTRAMUSCULAR | Status: DC | PRN
  Administered 2019-05-27: 4 mg via INTRAVENOUS

## 2019-05-27 NOTE — H&P (Signed)
@LOGO @   Primary Care Physician:  Glenda Chroman, MD Primary Gastroenterologist:  Dr. Gala Romney  Pre-Procedure History & Physical: HPI:  Matthew Graham is a 69 y.o. male is here for a screening colonoscopy.  Negative colonoscopy 10 years ago.  No GI symptoms currently.  Constipation well managed.  Past Medical History:  Diagnosis Date  . Abnormal gait   . Diabetes ([place])   . Hypercholesteremia   . Hypertension   . Seasonal allergies     Past Surgical History:  Procedure Laterality Date  . APPENDECTOMY    . CHOLECYSTECTOMY    . COLONOSCOPY     normal screening  . HERNIA REPAIR      Prior to Admission medications   Medication Sig Start Date End Date Taking? Authorizing Provider  amLODipine (NORVASC) 5 MG tablet Take 5 mg by mouth daily. 07/12/18  Yes [provider]  atorvastatin (LIPITOR) 10 MG tablet Take 10 mg by mouth daily.   Yes [provider]  irbesartan (AVAPRO) 300 MG tablet Take 300 mg by mouth daily.  07/12/18  Yes [provider]  Polyvinyl Alcohol-Povidone (REFRESH OP) Apply 1 drop to eye as needed (dry eye).    Yes [provider]  rOPINIRole (REQUIP) 2 MG tablet Take 1 tablet (2 mg total) by mouth 3 (three) times daily. 12/18/18  Yes Marcial Pacas, MD  polyethylene glycol-electrolytes (TRILYTE) 420 g solution Take 4,000 mLs by mouth as directed. 04/27/19   Daneil Dolin, MD    Allergies as of 04/27/2019 - Review Complete 04/27/2019  Allergen Reaction Noted  . Lisinopril Nausea Only 09/10/2018  . Other  09/10/2018    Family History  Problem Relation Age of Onset  . Alzheimer's disease Mother   . Diabetes Mother   . Hypertension Mother   . Other Father        potential complications from hip surgery  . Colon cancer Neg Hx     Social History   Socioeconomic History  . Marital status: Married    Spouse name: Not on file  . Number of children: 2  . Years of education: 62  . Highest education level: High school graduate   Occupational History  . Occupation: Retired  Tobacco Use  . Smoking status: Never Smoker  . Smokeless tobacco: Never Used  Substance and Sexual Activity  . Alcohol use: Not Currently  . Drug use: Never  . Sexual activity: Not on file  Other Topics Concern  . Not on file  Social History Narrative   Lives at home with wife.    Right-handed.   Rare caffeine use.   Social Determinants of Health   Financial Resource Strain:   . Difficulty of Paying Living Expenses:   Food Insecurity:   . Worried About Charity fundraiser in the Last Year:   . Arboriculturist in the Last Year:   Transportation Needs:   . Film/video editor (Medical):   Marland Kitchen Lack of Transportation (Non-Medical):   Physical Activity:   . Days of Exercise per Week:   . Minutes of Exercise per Session:   Stress:   . Feeling of Stress :   Social Connections:   . Frequency of Communication with Friends and Family:   . Frequency of Social Gatherings with Friends and Family:   . Attends Religious Services:   . Active Member of Clubs or Organizations:   . Attends Archivist Meetings:   Marland Kitchen Marital Status:   Intimate  Partner Violence:   . Fear of Current or Ex-Partner:   . Emotionally Abused:   Marland Kitchen Physically Abused:   . Sexually Abused:     Review of Systems: See HPI, otherwise negative ROS  Physical Exam: BP (!) 149/69   Pulse 90   Temp 98.3 F (36.8 C) (Oral)   Resp 18   Ht 5\' 9"  (1.753 m)   Wt 67.1 kg   SpO2 99%   BMI 21.86 kg/m  General:   Alert,  Well-developed, well-nourished, pleasant and cooperative in NAD  rhonchi. No acute distress. Heart:  Regular rate and rhythm; no murmurs, clicks, rubs,  or gallops. Abdomen:  Soft, nontender and nondistended. No masses, hepatosplenomegaly or hernias noted. Normal bowel sounds, without guarding, and without rebound.    Impression/Plan: Matthew Graham is now here to undergo a screening colonoscopy.  Average rescreening  examination.  Risks, benefits, limitations, imponderables and alternatives regarding colonoscopy have been reviewed with the patient. Questions have been answered. All parties agreeable.     Notice:  This dictation was prepared with Dragon dictation along with smaller phrase technology. Any transcriptional errors that result from this process are unintentional and may not be corrected upon review.

## 2019-05-27 NOTE — Discharge Instructions (Signed)
Colonoscopy Discharge Instructions  Read the instructions outlined below and refer to this sheet in the next few weeks. These discharge instructions provide you with general information on caring for yourself after you leave the hospital. Your doctor may also give you specific instructions. While your treatment has been planned according to the most current medical practices available, unavoidable complications occasionally occur. If you have any problems or questions after discharge, call Dr. Jena Gauss at [phone]. ACTIVITY  You may resume your regular activity, but move at a slower pace for the next 24 hours.   Take frequent rest periods for the next 24 hours.   Walking will help get rid of the air and reduce the bloated feeling in your belly (abdomen).   No driving for 24 hours (because of the medicine (anesthesia) used during the test).    Do not sign any important legal documents or operate any machinery for 24 hours (because of the anesthesia used during the test).  NUTRITION  Drink plenty of fluids.   You may resume your normal diet as instructed by your doctor.   Begin with a light meal and progress to your normal diet. Heavy or fried foods are harder to digest and may make you feel sick to your stomach (nauseated).   Avoid alcoholic beverages for 24 hours or as instructed.  MEDICATIONS  You may resume your normal medications unless your doctor tells you otherwise.  WHAT YOU CAN EXPECT TODAY  Some feelings of bloating in the abdomen.   Passage of more gas than usual.   Spotting of blood in your stool or on the toilet paper.  IF YOU HAD POLYPS REMOVED DURING THE COLONOSCOPY:  No aspirin products for 7 days or as instructed.   No alcohol for 7 days or as instructed.   Eat a soft diet for the next 24 hours.  FINDING OUT THE RESULTS OF YOUR TEST Not all test results are available during your visit. If your test results are not back during the visit, make an appointment  with your caregiver to find out the results. Do not assume everything is normal if you have not heard from your caregiver or the medical facility. It is important for you to follow up on all of your test results.  SEEK IMMEDIATE MEDICAL ATTENTION IF:  You have more than a spotting of blood in your stool.   Your belly is swollen (abdominal distention).   You are nauseated or vomiting.   You have a temperature over 101.   You have abdominal pain or discomfort that is severe or gets worse throughout the day.   Diverticulosis information provided  I do not recommend a future colonoscopy unless new symptoms develop  At patient request, I called wife, [name] at [phone]  -no answer    Diverticulosis  Diverticulosis is a condition that develops when small pouches (diverticula) form in the wall of the large intestine (colon). The colon is where water is absorbed and stool (feces) is formed. The pouches form when the inside layer of the colon pushes through weak spots in the outer layers of the colon. You may have a few pouches or many of them. The pouches usually do not cause problems unless they become inflamed or infected. When this happens, the condition is called diverticulitis. What are the causes? The cause of this condition is not known. What increases the risk? The following factors may make you more likely to develop this condition:  Being older than age 45. Your  risk for this condition increases with age. Diverticulosis is rare among people younger than age 71. By age 45, many people have it.  Eating a low-fiber diet.  Having frequent constipation.  Being overweight.  Not getting enough exercise.  Smoking.  Taking over-the-counter pain medicines, like aspirin and ibuprofen.  Having a family history of diverticulosis. What are the signs or symptoms? In most people, there are no symptoms of this condition. If you do have symptoms, they may  include:  Bloating.  Cramps in the abdomen.  Constipation or diarrhea.  Pain in the lower left side of the abdomen. How is this diagnosed? Because diverticulosis usually has no symptoms, it is most often diagnosed during an exam for other colon problems. The condition may be diagnosed by:  Using a flexible scope to examine the colon (colonoscopy).  Taking an X-ray of the colon after dye has been put into the colon (barium enema).  Having a CT scan. How is this treated? You may not need treatment for this condition. Your health care provider may recommend treatment to prevent problems. You may need treatment if you have symptoms or if you previously had diverticulitis. Treatment may include:  Eating a high-fiber diet.  Taking a fiber supplement.  Taking a live bacteria supplement (probiotic).  Taking medicine to relax your colon. Follow these instructions at home: Medicines  Take over-the-counter and prescription medicines only as told by your health care provider.  If told by your health care provider, take a fiber supplement or probiotic. Constipation prevention Your condition may cause constipation. To prevent or treat constipation, you may need to:  Drink enough fluid to keep your urine pale yellow.  Take over-the-counter or prescription medicines.  Eat foods that are high in fiber, such as beans, whole grains, and fresh fruits and vegetables.  Limit foods that are high in fat and processed sugars, such as fried or sweet foods.  General instructions  Try not to strain when you have a bowel movement.  Keep all follow-up visits as told by your health care provider. This is important. Contact a health care provider if you:  Have pain in your abdomen.  Have bloating.  Have cramps.  Have not had a bowel movement in 3 days. Get help right away if:  Your pain gets worse.  Your bloating becomes very bad.  You have a fever or chills, and your symptoms  suddenly get worse.  You vomit.  You have bowel movements that are bloody or black.  You have bleeding from your rectum. Summary  Diverticulosis is a condition that develops when small pouches (diverticula) form in the wall of the large intestine (colon).  You may have a few pouches or many of them.  This condition is most often diagnosed during an exam for other colon problems.  Treatment may include increasing the fiber in your diet, taking supplements, or taking medicines. This information is not intended to replace advice given to you by your health care provider. Make sure you discuss any questions you have with your health care provider. Document Revised: 09/25/2018 Document Reviewed: 09/25/2018 Elsevier Patient Education  [place].

## 2019-05-27 NOTE — Op Note (Signed)
[facility] Patient Name: Matthew Graham Procedure Date: 05/27/2019 8:06 AM MRN: [id number] Date of Birth: [birth year] Attending MD: Gennette Pac , MD CSN: [id number] Age: 69 Admit Type: Outpatient Procedure:                Colonoscopy Indications:              Screening for colorectal malignant neoplasm Providers:                Gennette Pac, MD, Edrick Kins, RN, Burke Keels, Technician Referring MD:              Medicines:                Midazolam 5 mg IV, Meperidine 40 mg IV Complications:            No immediate complications. Estimated Blood Loss:     Estimated blood loss: none. Procedure:                Pre-Anesthesia Assessment:                           - Prior to the procedure, a History and Physical                            was performed, and patient medications and                            allergies were reviewed. The patient's tolerance of                            previous anesthesia was also reviewed. The risks                            and benefits of the procedure and the sedation                            options and risks were discussed with the patient.                            All questions were answered, and informed consent                            was obtained. Prior Anticoagulants: The patient has                            taken no previous anticoagulant or antiplatelet                            agents. ASA Grade Assessment: II - A patient with                            mild systemic disease. After reviewing the risks  and benefits, the patient was deemed in                            satisfactory condition to undergo the procedure.                           After obtaining informed consent, the colonoscope                            was passed under direct vision. Throughout the                            procedure, the patient's blood pressure, pulse, and                             oxygen saturations were monitored continuously. The                            CF-[id number] ([id number]) scope was introduced through                            the anus and advanced to the the cecum, identified                            by appendiceal orifice and ileocecal valve. The                            colonoscopy was performed without difficulty. The                            patient tolerated the procedure well. The quality                            of the bowel preparation was adequate. Scope In: 8:24:35 AM Scope Out: 8:38:00 AM Scope Withdrawal Time: 0 hours 8 minutes 24 seconds  Total Procedure Duration: 0 hours 13 minutes 25 seconds  Findings:      The perianal and digital rectal examinations were normal.      Scattered medium-mouthed diverticula were found in the sigmoid colon and       descending colon.      The exam was otherwise without abnormality on direct and retroflexion       views. Impression:               - Diverticulosis in the sigmoid colon and in the                            descending colon.                           - The examination was otherwise normal on direct                            and retroflexion views.                           -  No specimens collected. Moderate Sedation:      Moderate (conscious) sedation was administered by the endoscopy nurse       and supervised by the endoscopist. The following parameters were       monitored: oxygen saturation, heart rate, blood pressure, respiratory       rate, EKG, adequacy of pulmonary ventilation, and response to care.       Total physician intraservice time was 40 minutes. Recommendation:           - Patient has a contact number available for                            emergencies. The signs and symptoms of potential                            delayed complications were discussed with the                            patient. Return to normal activities tomorrow.                             Written discharge instructions were provided to the                            patient.                           - Resume previous diet.                           - Continue present medications.                           - No repeat colonoscopy due to current age (14                            years or older).                           - Return to GI clinic PRN. Procedure Code(s):        --- Professional ---                           [phone], Colonoscopy, flexible; diagnostic, including                            collection of specimen(s) by brushing or washing,                            when performed (separate procedure)                           99153, Moderate sedation; each additional 15                            minutes intraservice time  09983, Moderate sedation; each additional 15                            minutes intraservice time                           G0500, Moderate sedation services provided by the                            same physician or other qualified health care                            professional performing a gastrointestinal                            endoscopic service that sedation supports,                            requiring the presence of an independent trained                            observer to assist in the monitoring of the                            patient's level of consciousness and physiological                            status; initial 15 minutes of intra-service time;                            patient age 39 years or older (additional time may                            be reported with 38250, as appropriate) Diagnosis Code(s):        --- Professional ---                           Z12.11, Encounter for screening for malignant                            neoplasm of colon                           K57.30, Diverticulosis of large intestine without                            perforation or abscess without  bleeding CPT copyright 2019 American Medical Association. All rights reserved. The codes documented in this report are preliminary and upon coder review may  be revised to meet current compliance requirements. Gerrit Friends. Krishauna Schatzman, MD Gennette Pac, MD 05/27/2019 8:48:54 AM This report has been signed electronically. Number of Addenda: 0

## 2019-06-17 NOTE — Progress Notes (Signed)
PATIENT: Matthew Graham DOB: [birth year]  REASON FOR VISIT: follow up HISTORY FROM: patient  HISTORY OF PRESENT ILLNESS: Today 06/18/19  HISTORY  Matthew Graham is a 69 year old male, seen in request by his primary care physician [name], [name] for evaluation of possible Parkinson's disease, he is accompanied by his wife [name], initial evaluation was on September 11, 2018.  I have reviewed and summarized the referring note from the referring physician.  He had a past medical history of attention, hyperlipidemia, diabetes, still works part-time job, delivery of medicine for [employer]  Around early 2020, he was noted to have gradual onset right hand tremor, change of walking posturing, leaning forward, he also noticed mild rigidity of right leg, difficulty making rapid rhythmic movements with his right foot  He does have couple years history of gradually decreased sense of smell, he denies REM sleep disorder, when he bends down and gets up quickly, he sometime has lightheadedness sensation.  Patient reported normal MRI of brain at [facility], laboratory evaluations was done in February  UPDATE Dec 18 2018: He is tolerating Requip 1 mg instead of 3 times a day, he only take 1 mg twice a day, he denies significant side effect, he still work part-time job driving cars delivering medications, he does notice some improvement, he can move better  We personally reviewed MRI of the brain, mild supratentorium small vessel disease, there was no acute abnormality  Update June 18, 2019 SS: He is taking Requip 2 mg, 1 tablet 3 times a day.  Is seeing GI for dysphagia, had colonoscopy, was normal, along with barium swallow, speech therapy.  Continues to have dysphagia, is very careful, eat slowly, chew his food very well. Has some drooling, feels his saliva is thick.  Denies freezing episodes, occasional resting tremor to the right hand, usually more than the left.  No falls.   Continues to work part-time delivering medications. He hasn't seen much benefit from Requip, can't tell much difference.  REVIEW OF SYSTEMS: Out of a complete 14 system review of symptoms, the patient complains only of the following symptoms, and all other reviewed systems are negative.  Tremor  ALLERGIES: Allergies  Allergen Reactions  . Lisinopril Nausea Only  . Other     Poliovax caused fatigue.    HOME MEDICATIONS: Outpatient Medications Prior to Visit  Medication Sig Dispense Refill  . amLODipine (NORVASC) 5 MG tablet Take 5 mg by mouth daily.    Marland Kitchen atorvastatin (LIPITOR) 10 MG tablet Take 10 mg by mouth daily.    . irbesartan (AVAPRO) 300 MG tablet Take 300 mg by mouth daily.     . polyethylene glycol-electrolytes (TRILYTE) 420 g solution Take 4,000 mLs by mouth as directed. 4000 mL 0  . Polyvinyl Alcohol-Povidone (REFRESH OP) Apply 1 drop to eye as needed (dry eye).     Marland Kitchen rOPINIRole (REQUIP) 2 MG tablet Take 1 tablet (2 mg total) by mouth 3 (three) times daily. 90 tablet 11   No facility-administered medications prior to visit.    PAST MEDICAL HISTORY: Past Medical History:  Diagnosis Date  . Abnormal gait   . Diabetes (HCC)   . Hypercholesteremia   . Hypertension   . Parkinson disease (HCC)   . Seasonal allergies     PAST SURGICAL HISTORY: Past Surgical History:  Procedure Laterality Date  . APPENDECTOMY    . CHOLECYSTECTOMY    . COLONOSCOPY     normal screening  . COLONOSCOPY N/A 05/27/2019  Procedure: COLONOSCOPY;  Surgeon: Daneil Dolin, MD;  Location: [facility];  Service: Endoscopy;  Laterality: N/A;  8:15am  . HERNIA REPAIR      FAMILY HISTORY: Family History  Problem Relation Age of Onset  . Alzheimer's disease Mother   . Diabetes Mother   . Hypertension Mother   . Other Father        potential complications from hip surgery  . Colon cancer Neg Hx     SOCIAL HISTORY: Social History   Socioeconomic History  . Marital status: Married     Spouse name: [name]  . Number of children: 2  . Years of education: 77  . Highest education level: High school graduate  Occupational History  . Occupation: Retired  Tobacco Use  . Smoking status: Never Smoker  . Smokeless tobacco: Never Used  Substance and Sexual Activity  . Alcohol use: Not Currently  . Drug use: Never  . Sexual activity: Not on file  Other Topics Concern  . Not on file  Social History Narrative   Lives at home with wife.    Right-handed.   Rare caffeine use.   Social Determinants of Health   Financial Resource Strain:   . Difficulty of Paying Living Expenses:   Food Insecurity:   . Worried About Charity fundraiser in the Last Year:   . Arboriculturist in the Last Year:   Transportation Needs:   . Film/video editor (Medical):   Marland Kitchen Lack of Transportation (Non-Medical):   Physical Activity:   . Days of Exercise per Week:   . Minutes of Exercise per Session:   Stress:   . Feeling of Stress :   Social Connections:   . Frequency of Communication with Friends and Family:   . Frequency of Social Gatherings with Friends and Family:   . Attends Religious Services:   . Active Member of Clubs or Organizations:   . Attends Archivist Meetings:   Marland Kitchen Marital Status:   Intimate Partner Violence:   . Fear of Current or Ex-Partner:   . Emotionally Abused:   Marland Kitchen Physically Abused:   . Sexually Abused:    PHYSICAL EXAM  Vitals:   06/18/19 1226  BP: 130/69  Pulse: 70  Temp: 98.3 F (36.8 C)  Weight: 150 lb 6.4 oz (68.2 kg)  Height: 5\' 9"  (1.753 m)   Body mass index is 22.21 kg/m.  Generalized: Well developed, in no acute distress   Neurological examination  Mentation: Alert oriented to time, place, history taking. Follows all commands speech and language fluent, mild masking of the face is seen Cranial nerve II-XII: Pupils were equal round reactive to light. Extraocular movements were full, visual field were full on confrontational test.  Facial sensation and strength were normal.  Head turning and shoulder shrug  were normal and symmetric. Motor: The motor testing reveals 5 over 5 strength of all 4 extremities. Good symmetric motor tone is noted throughout.  No resting tremor was noted.  Rigidity of right upper extremity, more than left Sensory: Sensory testing is intact to soft touch on all 4 extremities. No evidence of extinction is noted.  Coordination: Cerebellar testing reveals good finger-nose-finger and heel-to-shin bilaterally.  Gait and station: Has to push off from seated position, gait is forward leaning, decreased right arm swing, good turns Reflexes: Deep tendon reflexes are symmetric and normal bilaterally.   DIAGNOSTIC DATA (LABS, IMAGING, TESTING) - I reviewed patient records, labs, notes, testing and  imaging myself where available.  No results found for: WBC, HGB, HCT, MCV, PLT No results found for: NA, K, CL, CO2, GLUCOSE, BUN, CREATININE, CALCIUM, PROT, ALBUMIN, AST, ALT, ALKPHOS, BILITOT, GFRNONAA, GFRAA No results found for: CHOL, HDL, LDLCALC, LDLDIRECT, TRIG, CHOLHDL No results found for: FHLK5G No results found for: VITAMINB12 No results found for: TSH  ASSESSMENT AND PLAN 69 y.o. year old male  has a past medical history of Abnormal gait, Diabetes (HCC), Hypercholesteremia, Hypertension, Parkinson disease (HCC), and Seasonal allergies. here with:  1.  Parkinson's disease -Has reported problems with dysphagia, GI unable to determine etiology from work-up -We will continue Requip 2 mg 3 times a day  -Try Sinemet 25/100 mg, 1/2 tablet 3 times a day to see if benefit for dysphagia, overall symptoms -Follow-up in 6 months or sooner if needed, call for dose adjustment   I spent 20 minutes of face-to-face and non-face-to-face time with patient.  This included previsit chart review, lab review, study review, order entry, electronic health record documentation, patient education.   Margie Ege, AGNP-C,  DNP 06/18/2019, 1:03 PM [facility] [address], [address] [place], [place] [address] [phone]

## 2019-06-18 ENCOUNTER — Encounter: Payer: Self-pay | Admitting: Neurology

## 2019-06-18 ENCOUNTER — Ambulatory Visit (INDEPENDENT_AMBULATORY_CARE_PROVIDER_SITE_OTHER): Payer: Medicare Other | Admitting: Neurology

## 2019-06-18 ENCOUNTER — Other Ambulatory Visit: Payer: Self-pay

## 2019-06-18 VITALS — BP 130/69 | HR 70 | Temp 98.3°F | Ht 69.0 in | Wt 150.4 lb

## 2019-06-18 DIAGNOSIS — G20 Parkinson's disease: Secondary | ICD-10-CM | POA: Diagnosis not present

## 2019-06-18 MED ORDER — CARBIDOPA-LEVODOPA 25-100 MG PO TABS: 0.5000 | ORAL_TABLET | Freq: Three times a day (TID) | ORAL | 11 refills | Status: DC

## 2019-06-18 MED ORDER — ROPINIROLE HCL 2 MG PO TABS: 2.0000 mg | ORAL_TABLET | Freq: Three times a day (TID) | ORAL | 11 refills | Status: DC

## 2019-06-18 NOTE — Patient Instructions (Addendum)
Try Sinemet 25-100 mg 1/2 tablet 3 times daily, start taking with food, can cause nausea Continue taking Requip  See you back in 6 months   Carbidopa; Levodopa tablets What is this medicine? CARBIDOPA;LEVODOPA (kar bi DOE pa; lee voe DOE pa) is used to treat the symptoms of Parkinson's disease. This medicine may be used for other purposes; ask your health care provider or pharmacist if you have questions. COMMON BRAND NAME(S): Atamet, SINEMET What should I tell my health care provider before I take this medicine? They need to know if you have any of these conditions:  depression or other mental illness  diabetes  glaucoma  heart disease, including history of a heart attack  history of irregular heartbeat  kidney disease  liver disease  lung or breathing disease, like asthma  narcolepsy  sleep apnea  stomach or intestine problems  an unusual or allergic reaction to levodopa, carbidopa, other medicines, foods, dyes, or preservatives  pregnant or trying to get pregnant  breast-feeding How should I use this medicine? Take this medicine by mouth with a glass of water. Follow the directions on the prescription label. Take your doses at regular intervals. Do not take your medicine more often than directed. Do not stop taking except on the advice of your doctor or health care professional. Talk to your pediatrician regarding the use of this medicine in children. Special care may be needed. Overdosage: If you think you have taken too much of this medicine contact a poison control center or emergency room at once. NOTE: This medicine is only for you. Do not share this medicine with others. What if I miss a dose? If you miss a dose, take it as soon as you can. If it is almost time for your next dose, take only that dose. Do not take double or extra doses. What may interact with this medicine? Do not take this medicine with any of the following medications:  MAOIs like Marplan,  Nardil, and Parnate  reserpine  tetrabenazine This medicine may also interact with the following medications:  alcohol  droperidol  entacapone  iron supplements or multivitamins with iron  isoniazid, INH  linezolid  medicines for depression, anxiety, or psychotic disturbances  medicines for high blood pressure  medicines for sleep  metoclopramide  papaverine  procarbazine  tedizolid  rasagiline  selegiline  tolcapone This list may not describe all possible interactions. Give your health care provider a list of all the medicines, herbs, non-prescription drugs, or dietary supplements you use. Also tell them if you smoke, drink alcohol, or use illegal drugs. Some items may interact with your medicine. What should I watch for while using this medicine? Visit your health care professional for regular checks on your progress. Tell your health care professional if your symptoms do not start to get better or if they get worse. Do not stop taking except on your health care professional's advice. You may develop a severe reaction. Your health care professional will tell you how much medicine to take. You may experience a wearing of effect prior to the time for your next dose of this medicine. You may also experience an on-off effect where the medicine apparently stops working for anything from a minute to several hours, then suddenly starts working again. Tell your doctor or health care professional if any of these symptoms happen to you. Your dose may need to be changed. A high protein diet can slow or prevent absorption of this medicine. Avoid high protein foods  near the time of taking this medicine to help to prevent these problems. Take this medicine at least 30 minutes before eating or one hour after meals. You may want to eat higher protein foods later in the day or in small amounts. Discuss your diet with your doctor or health care professional or nutritionist. You may get  drowsy or dizzy. Do not drive, use machinery, or do anything that needs mental alertness until you know how this drug affects you. Do not stand or sit up quickly, especially if you are an older patient. This reduces the risk of dizzy or fainting spells. Alcohol may interfere with the effect of this medicine. Avoid alcoholic drinks. When taking this medicine, you may fall asleep without notice. You may be doing activities like driving a car, talking, or eating. You may not feel drowsy before it happens. Contact your health care provider right away if this happens to you. There have been reports of increased sexual urges or other strong urges such as gambling while taking this medicine. If you experience any of these while taking this medicine, you should report this to your health care provider as soon as possible. If you are diabetic, this medicine may interfere with the accuracy of some tests for sugar or ketones in the urine (does not interfere with blood tests). Check with your doctor or health care professional before changing the dose of your diabetic medicine. This medicine may discolor the urine or sweat, making it look darker or red in color. This is of no cause for concern. However, this may stain clothing or fabrics. This medicine may cause a decrease in vitamin B6. You should make sure that you get enough vitamin B6 while you are taking this medicine. Discuss the foods you eat and the vitamins you take with your health care professional. What side effects may I notice from receiving this medicine? Side effects that you should report to your doctor or health care professional as soon as possible:  allergic reactions like skin rash, itching or hives, swelling of the face, lips, or tongue  changes in emotions or moods  falling asleep during normal activities like driving  fast, irregular heartbeat  feeling faint or lightheaded, falls  fever  hallucinations  new or increased gambling  urges, sexual urges, uncontrolled spending, binge or compulsive eating, or other urges  stomach pain  trouble passing urine or change in the amount of urine  uncontrollable movements of the arms, face, head, mouth, neck, or upper body Side effects that usually do not require medical attention (report to your doctor or health care professional if they continue or are bothersome):  dizziness  headache  loss of appetite  nausea  trouble sleeping This list may not describe all possible side effects. Call your doctor for medical advice about side effects. You may report side effects to FDA at 1-800-FDA-1088. Where should I keep my medicine? Keep out of the reach of children. Store at room temperature between 15 and 30 degrees C (59 and 86 degrees F). Protect from light. Throw away any unused medicine after the expiration date. NOTE: This sheet is a summary. It may not cover all possible information. If you have questions about this medicine, talk to your doctor, pharmacist, or health care provider.  2020 Elsevier/Gold Standard (2018-11-03 19:49:59)

## 2019-07-20 DIAGNOSIS — M79671 Pain in right foot: Secondary | ICD-10-CM | POA: Diagnosis not present

## 2019-07-20 DIAGNOSIS — L110 Acquired keratosis follicularis: Secondary | ICD-10-CM | POA: Diagnosis not present

## 2019-07-20 DIAGNOSIS — I739 Peripheral vascular disease, unspecified: Secondary | ICD-10-CM | POA: Diagnosis not present

## 2019-07-20 DIAGNOSIS — M79672 Pain in left foot: Secondary | ICD-10-CM | POA: Diagnosis not present

## 2019-07-20 DIAGNOSIS — E1140 Type 2 diabetes mellitus with diabetic neuropathy, unspecified: Secondary | ICD-10-CM | POA: Diagnosis not present

## 2019-07-21 NOTE — Progress Notes (Signed)
I have reviewed and agreed above plan. 

## 2019-07-23 DIAGNOSIS — Z299 Encounter for prophylactic measures, unspecified: Secondary | ICD-10-CM | POA: Diagnosis not present

## 2019-07-23 DIAGNOSIS — G20 Parkinson's disease: Secondary | ICD-10-CM | POA: Diagnosis not present

## 2019-07-23 DIAGNOSIS — I10 Essential (primary) hypertension: Secondary | ICD-10-CM | POA: Diagnosis not present

## 2019-07-23 DIAGNOSIS — E7800 Pure hypercholesterolemia, unspecified: Secondary | ICD-10-CM | POA: Diagnosis not present

## 2019-07-23 DIAGNOSIS — Z6822 Body mass index (BMI) 22.0-22.9, adult: Secondary | ICD-10-CM | POA: Diagnosis not present

## 2019-07-23 DIAGNOSIS — E1165 Type 2 diabetes mellitus with hyperglycemia: Secondary | ICD-10-CM | POA: Diagnosis not present

## 2019-10-05 DIAGNOSIS — L110 Acquired keratosis follicularis: Secondary | ICD-10-CM | POA: Diagnosis not present

## 2019-10-05 DIAGNOSIS — M79672 Pain in left foot: Secondary | ICD-10-CM | POA: Diagnosis not present

## 2019-10-05 DIAGNOSIS — M79671 Pain in right foot: Secondary | ICD-10-CM | POA: Diagnosis not present

## 2019-10-05 DIAGNOSIS — E1140 Type 2 diabetes mellitus with diabetic neuropathy, unspecified: Secondary | ICD-10-CM | POA: Diagnosis not present

## 2019-10-05 DIAGNOSIS — I739 Peripheral vascular disease, unspecified: Secondary | ICD-10-CM | POA: Diagnosis not present

## 2019-10-19 DIAGNOSIS — Z7189 Other specified counseling: Secondary | ICD-10-CM | POA: Diagnosis not present

## 2019-10-19 DIAGNOSIS — Z1331 Encounter for screening for depression: Secondary | ICD-10-CM | POA: Diagnosis not present

## 2019-10-19 DIAGNOSIS — R5383 Other fatigue: Secondary | ICD-10-CM | POA: Diagnosis not present

## 2019-10-19 DIAGNOSIS — Z1339 Encounter for screening examination for other mental health and behavioral disorders: Secondary | ICD-10-CM | POA: Diagnosis not present

## 2019-10-19 DIAGNOSIS — Z125 Encounter for screening for malignant neoplasm of prostate: Secondary | ICD-10-CM | POA: Diagnosis not present

## 2019-10-19 DIAGNOSIS — Z0000 Encounter for general adult medical examination without abnormal findings: Secondary | ICD-10-CM | POA: Diagnosis not present

## 2019-10-19 DIAGNOSIS — Z6821 Body mass index (BMI) 21.0-21.9, adult: Secondary | ICD-10-CM | POA: Diagnosis not present

## 2019-10-19 DIAGNOSIS — Z79899 Other long term (current) drug therapy: Secondary | ICD-10-CM | POA: Diagnosis not present

## 2019-10-19 DIAGNOSIS — Z299 Encounter for prophylactic measures, unspecified: Secondary | ICD-10-CM | POA: Diagnosis not present

## 2019-10-19 DIAGNOSIS — E7800 Pure hypercholesterolemia, unspecified: Secondary | ICD-10-CM | POA: Diagnosis not present

## 2019-10-19 DIAGNOSIS — R21 Rash and other nonspecific skin eruption: Secondary | ICD-10-CM | POA: Diagnosis not present

## 2019-10-19 DIAGNOSIS — I10 Essential (primary) hypertension: Secondary | ICD-10-CM | POA: Diagnosis not present

## 2019-12-02 DIAGNOSIS — I10 Essential (primary) hypertension: Secondary | ICD-10-CM | POA: Diagnosis not present

## 2019-12-02 DIAGNOSIS — E1165 Type 2 diabetes mellitus with hyperglycemia: Secondary | ICD-10-CM | POA: Diagnosis not present

## 2019-12-02 DIAGNOSIS — Z299 Encounter for prophylactic measures, unspecified: Secondary | ICD-10-CM | POA: Diagnosis not present

## 2019-12-02 DIAGNOSIS — G20 Parkinson's disease: Secondary | ICD-10-CM | POA: Diagnosis not present

## 2019-12-14 DIAGNOSIS — E1140 Type 2 diabetes mellitus with diabetic neuropathy, unspecified: Secondary | ICD-10-CM | POA: Diagnosis not present

## 2019-12-14 DIAGNOSIS — M79671 Pain in right foot: Secondary | ICD-10-CM | POA: Diagnosis not present

## 2019-12-14 DIAGNOSIS — I739 Peripheral vascular disease, unspecified: Secondary | ICD-10-CM | POA: Diagnosis not present

## 2019-12-14 DIAGNOSIS — M79672 Pain in left foot: Secondary | ICD-10-CM | POA: Diagnosis not present

## 2019-12-14 DIAGNOSIS — L110 Acquired keratosis follicularis: Secondary | ICD-10-CM | POA: Diagnosis not present

## 2019-12-21 ENCOUNTER — Other Ambulatory Visit: Payer: Self-pay

## 2019-12-21 ENCOUNTER — Ambulatory Visit (INDEPENDENT_AMBULATORY_CARE_PROVIDER_SITE_OTHER): Payer: Medicare Other | Admitting: Neurology

## 2019-12-21 ENCOUNTER — Encounter: Payer: Self-pay | Admitting: Neurology

## 2019-12-21 VITALS — BP 146/72 | HR 73 | Ht 69.0 in | Wt 150.6 lb

## 2019-12-21 DIAGNOSIS — G20 Parkinson's disease: Secondary | ICD-10-CM | POA: Diagnosis not present

## 2019-12-21 MED ORDER — CARBIDOPA-LEVODOPA 25-100 MG PO TABS: 1.0000 | ORAL_TABLET | Freq: Three times a day (TID) | ORAL | 3 refills | Status: DC

## 2019-12-21 NOTE — Progress Notes (Signed)
PATIENT: Matthew Graham DOB: 12-06-1950  REASON FOR VISIT: follow up HISTORY FROM: patient  HISTORY OF PRESENT ILLNESS: Today 12/21/19  HISTORY   Matthew Parcel Jonesis a 69 year old male, seen in request by his primary care physician [name], [name] for evaluation of possible Parkinson's disease, he is accompanied by his wife Matthew Graham, initial evaluation was on September 11, 2018.  I have reviewed and summarized the referring note from the referring physician. He had a past medical history of attention, hyperlipidemia, diabetes, still works part-time job, delivery of medicine for [employer]  Around early 2020, he was noted to have gradual onset right hand tremor, change of walking posturing, leaning forward, he also noticed mild rigidity of right leg, difficulty making rapid rhythmic movements with his right foot  He does have couple years history of gradually decreased sense of smell, he denies REM sleep disorder, when he bends down and gets up quickly, he sometime has lightheadedness sensation.  Patient reported normal MRI of brain at [facility], laboratory evaluations was done in February  UPDATE Dec 18 2018: He is tolerating Requip 1 mginstead of3 times a day, he only take 1 mg twice a day, he denies significant side effect, he still work part-time job driving cars delivering medications, hedoes notice some improvement, he can move better  We personally reviewed MRI of the brain, mild supratentorium small vessel disease, there was no acute abnormality  Update June 18, 2019 SS: He is taking Requip 2 mg, 1 tablet 3 times a day.  Is seeing GI for dysphagia, had colonoscopy, was normal, along with barium swallow, speech therapy.  Continues to have dysphagia, is very careful, eat slowly, chew his food very well. Has some drooling, feels his saliva is thick.  Denies freezing episodes, occasional resting tremor to the right hand, usually more than the left.  No falls.  Continues to work part-time delivering medications. He hasn't seen much benefit from Requip, can't tell much difference.  Update December 21, 2019 SS: On Sinemet 25/100 mg, 1/2 tablet 3 times a day, Requip 2 mg 3 times a day, with addition of Sinemet, less tremor, improvement with swallowing, no choking, completed ST, chews his food well, cuts it small, water at room temperature.  Does feel excess saliva, some drooling.  No falls, or freezing episodes.  Works part-time delivering medications 2 days a week.  Sleeps well once asleep. If stands too fast gets dizzy.  Enjoys walking, riding his bike.  Does have vivid dreams. Taking Sinemet 6:30, 12, 6, had been taking with food.   REVIEW OF SYSTEMS: Out of a complete 14 system review of symptoms, the patient complains only of the following symptoms, and all other reviewed systems are negative.  Tremor  ALLERGIES: Allergies  Allergen Reactions  . Lisinopril Nausea Only  . Other     Poliovax caused fatigue.    HOME MEDICATIONS: Outpatient Medications Prior to Visit  Medication Sig Dispense Refill  . amLODipine (NORVASC) 5 MG tablet Take 5 mg by mouth daily.    Marland Kitchen atorvastatin (LIPITOR) 10 MG tablet Take 10 mg by mouth daily.    . irbesartan (AVAPRO) 300 MG tablet Take 300 mg by mouth daily.     . Polyvinyl Alcohol-Povidone (REFRESH OP) Apply 1 drop to eye as needed (dry eye).     Marland Kitchen rOPINIRole (REQUIP) 2 MG tablet Take 1 tablet (2 mg total) by mouth 3 (three) times daily. 90 tablet 11  . carbidopa-levodopa (SINEMET) 25-100 MG tablet  Take 0.5 tablets by mouth 3 (three) times daily. 90 tablet 11  . polyethylene glycol-electrolytes (TRILYTE) 420 g solution Take 4,000 mLs by mouth as directed. 4000 mL 0   No facility-administered medications prior to visit.    PAST MEDICAL HISTORY: Past Medical History:  Diagnosis Date  . Abnormal gait   . Diabetes (HCC)   . Hypercholesteremia   . Hypertension   . Parkinson disease (HCC)   . Seasonal  allergies     PAST SURGICAL HISTORY: Past Surgical History:  Procedure Laterality Date  . APPENDECTOMY    . CHOLECYSTECTOMY    . COLONOSCOPY     normal screening  . COLONOSCOPY N/A 05/27/2019   Procedure: COLONOSCOPY;  Surgeon: Corbin Ade, MD;  Location[facility];  Service: Endoscopy;  Laterality: N/A;  8:15am  . HERNIA REPAIR      FAMILY HISTORY: Family History  Problem Relation Age of Onset  . Alzheimer's disease Mother   . Diabetes Mother   . Hypertension Mother   . Other Father        potential complications from hip surgery  . Colon cancer Neg Hx     SOCIAL HISTORY: Social History   Socioeconomic History  . Marital status: Married    Spouse name: Matthew Graham  . Number of children: 2  . Years of education: 38  . Highest education level: High school graduate  Occupational History  . Occupation: Retired  Tobacco Use  . Smoking status: Never Smoker  . Smokeless tobacco: Never Used  Vaping Use  . Vaping Use: Never used  Substance and Sexual Activity  . Alcohol use: Not Currently  . Drug use: Never  . Sexual activity: Not on file  Other Topics Concern  . Not on file  Social History Narrative   Lives at home with wife.    Right-handed.   Rare caffeine use.   Social Determinants of Health   Financial Resource Strain:   . Difficulty of Paying Living Expenses: Not on file  Food Insecurity:   . Worried About Programme researcher, broadcasting/film/video in the Last Year: Not on file  . Ran Out of Food in the Last Year: Not on file  Transportation Needs:   . Lack of Transportation (Medical): Not on file  . Lack of Transportation (Non-Medical): Not on file  Physical Activity:   . Days of Exercise per Week: Not on file  . Minutes of Exercise per Session: Not on file  Stress:   . Feeling of Stress : Not on file  Social Connections:   . Frequency of Communication with Friends and Family: Not on file  . Frequency of Social Gatherings with Friends and Family: Not on file  . Attends  Religious Services: Not on file  . Active Member of Clubs or Organizations: Not on file  . Attends Banker Meetings: Not on file  . Marital Status: Not on file  Intimate Partner Violence:   . Fear of Current or Ex-Partner: Not on file  . Emotionally Abused: Not on file  . Physically Abused: Not on file  . Sexually Abused: Not on file   PHYSICAL EXAM  Vitals:   12/21/19 0740  BP: (!) 146/72  Pulse: 73  Weight: 150 lb 9.6 oz (68.3 kg)  Height: 5\' 9"  (1.753 m)   Body mass index is 22.24 kg/m.  Generalized: Well developed, in no acute distress  Neurological examination  Mentation: Alert oriented to time, place, history taking. Follows all commands speech  and language fluent Cranial nerve II-XII: Pupils were equal round reactive to light. Extraocular movements were full, visual field were full on confrontational test. Facial sensation and strength were normal. Head turning and shoulder shrug  were normal and symmetric.  Masking of the face is seen. Motor: The motor testing reveals 5 over 5 strength of all 4 extremities. Good symmetric motor tone is noted throughout.  No resting tremor noted.  No significant bradykinesia. Sensory: Sensory testing is intact to soft touch on all 4 extremities. No evidence of extinction is noted.  Coordination: Cerebellar testing reveals good finger-nose-finger and heel-to-shin bilaterally.  Gait and station: Can rise from seated position without pushoff, forward leaning, good stride, decreased arm swing on the right, good turns. Reflexes: Deep tendon reflexes are symmetric and normal bilaterally.   DIAGNOSTIC DATA (LABS, IMAGING, TESTING) - I reviewed patient records, labs, notes, testing and imaging myself where available.  No results found for: WBC, HGB, HCT, MCV, PLT No results found for: NA, K, CL, CO2, GLUCOSE, BUN, CREATININE, CALCIUM, PROT, ALBUMIN, AST, ALT, ALKPHOS, BILITOT, GFRNONAA, GFRAA No results found for: CHOL, HDL, LDLCALC,  LDLDIRECT, TRIG, CHOLHDL No results found for: IRWE3X No results found for: VITAMINB12 No results found for: TSH  ASSESSMENT AND PLAN 69 y.o. year old male  has a past medical history of Abnormal gait, Diabetes (HCC), Hypercholesteremia, Hypertension, Parkinson disease (HCC), and Seasonal allergies. here with:  1.  Parkinson's disease -With Sinemet, some benefit to dysphagia, tremor improved -Increase Sinemet 25/100 mg, 1 tablet 3 times a day, we talked about taking 30 minutes before or 1 hour after meals  -Continue Requip 2 mg 3 times a day -Encouraged exercise, check into [employer]  -Continue exercises learned in ST, would be good to check in with GI -Follow-up in 6 months or sooner if needed  I spent 30 minutes of face-to-face and non-face-to-face time with patient.  This included previsit chart review, lab review, study review, order entry, electronic health record documentation, patient education.  Margie Ege, AGNP-C, DNP 12/21/2019, 8:13 AM [facility] [address] [place], [place] [address] [phone]

## 2019-12-21 NOTE — Patient Instructions (Addendum)
Increase Sinemet 25-100 mg 1 tablet 3 times daily  May be good to follow up with your GI  Continue exercise, check out [employer]  Continue Requip  See you back in 6 months

## 2020-01-29 DIAGNOSIS — Z23 Encounter for immunization: Secondary | ICD-10-CM | POA: Diagnosis not present

## 2020-02-01 DIAGNOSIS — H52223 Regular astigmatism, bilateral: Secondary | ICD-10-CM | POA: Diagnosis not present

## 2020-02-01 DIAGNOSIS — E119 Type 2 diabetes mellitus without complications: Secondary | ICD-10-CM | POA: Diagnosis not present

## 2020-02-01 DIAGNOSIS — H5203 Hypermetropia, bilateral: Secondary | ICD-10-CM | POA: Diagnosis not present

## 2020-02-01 DIAGNOSIS — H2513 Age-related nuclear cataract, bilateral: Secondary | ICD-10-CM | POA: Diagnosis not present

## 2020-02-01 DIAGNOSIS — H524 Presbyopia: Secondary | ICD-10-CM | POA: Diagnosis not present

## 2020-02-01 DIAGNOSIS — H0288A Meibomian gland dysfunction right eye, upper and lower eyelids: Secondary | ICD-10-CM | POA: Diagnosis not present

## 2020-02-01 DIAGNOSIS — H0288B Meibomian gland dysfunction left eye, upper and lower eyelids: Secondary | ICD-10-CM | POA: Diagnosis not present

## 2020-02-01 DIAGNOSIS — H02055 Trichiasis without entropian left lower eyelid: Secondary | ICD-10-CM | POA: Diagnosis not present

## 2020-02-01 NOTE — Progress Notes (Signed)
I have reviewed and agreed above plan. 

## 2020-03-15 DIAGNOSIS — Z299 Encounter for prophylactic measures, unspecified: Secondary | ICD-10-CM | POA: Diagnosis not present

## 2020-03-15 DIAGNOSIS — Z789 Other specified health status: Secondary | ICD-10-CM | POA: Diagnosis not present

## 2020-03-15 DIAGNOSIS — I10 Essential (primary) hypertension: Secondary | ICD-10-CM | POA: Diagnosis not present

## 2020-03-15 DIAGNOSIS — Z6822 Body mass index (BMI) 22.0-22.9, adult: Secondary | ICD-10-CM | POA: Diagnosis not present

## 2020-03-15 DIAGNOSIS — G20 Parkinson's disease: Secondary | ICD-10-CM | POA: Diagnosis not present

## 2020-03-15 DIAGNOSIS — E7800 Pure hypercholesterolemia, unspecified: Secondary | ICD-10-CM | POA: Diagnosis not present

## 2020-03-15 DIAGNOSIS — E1165 Type 2 diabetes mellitus with hyperglycemia: Secondary | ICD-10-CM | POA: Diagnosis not present

## 2020-04-05 DIAGNOSIS — E1140 Type 2 diabetes mellitus with diabetic neuropathy, unspecified: Secondary | ICD-10-CM | POA: Diagnosis not present

## 2020-04-05 DIAGNOSIS — L110 Acquired keratosis follicularis: Secondary | ICD-10-CM | POA: Diagnosis not present

## 2020-04-05 DIAGNOSIS — M79671 Pain in right foot: Secondary | ICD-10-CM | POA: Diagnosis not present

## 2020-04-05 DIAGNOSIS — I739 Peripheral vascular disease, unspecified: Secondary | ICD-10-CM | POA: Diagnosis not present

## 2020-04-05 DIAGNOSIS — M79672 Pain in left foot: Secondary | ICD-10-CM | POA: Diagnosis not present

## 2020-04-15 DIAGNOSIS — Z299 Encounter for prophylactic measures, unspecified: Secondary | ICD-10-CM | POA: Diagnosis not present

## 2020-04-15 DIAGNOSIS — E1165 Type 2 diabetes mellitus with hyperglycemia: Secondary | ICD-10-CM | POA: Diagnosis not present

## 2020-04-15 DIAGNOSIS — I10 Essential (primary) hypertension: Secondary | ICD-10-CM | POA: Diagnosis not present

## 2020-04-15 DIAGNOSIS — U071 COVID-19: Secondary | ICD-10-CM | POA: Diagnosis not present

## 2020-04-15 DIAGNOSIS — G20 Parkinson's disease: Secondary | ICD-10-CM | POA: Diagnosis not present

## 2020-06-20 ENCOUNTER — Encounter: Payer: Self-pay | Admitting: Neurology

## 2020-06-20 ENCOUNTER — Ambulatory Visit (INDEPENDENT_AMBULATORY_CARE_PROVIDER_SITE_OTHER): Payer: Medicare Other | Admitting: Neurology

## 2020-06-20 VITALS — BP 131/68 | HR 68 | Ht 69.0 in | Wt 156.5 lb

## 2020-06-20 DIAGNOSIS — F482 Pseudobulbar affect: Secondary | ICD-10-CM | POA: Diagnosis not present

## 2020-06-20 DIAGNOSIS — G3184 Mild cognitive impairment, so stated: Secondary | ICD-10-CM | POA: Diagnosis not present

## 2020-06-20 DIAGNOSIS — G20 Parkinson's disease: Secondary | ICD-10-CM

## 2020-06-20 MED ORDER — ROPINIROLE HCL 2 MG PO TABS: 2.0000 mg | ORAL_TABLET | Freq: Three times a day (TID) | ORAL | 3 refills | Status: DC

## 2020-06-20 NOTE — Progress Notes (Signed)
ASSESSMENT AND PLAN 70 y.o. year old male  Parkinson's disease Pseudobulbar affect Mild cognitive impairment  [facility] examination 28/30 today  Overall doing well with current dose of Sinemet 25/100 mg plus Requip 2 mg 3 times a day,  Encourage more compliant with medications, moderate exercise  Return to clinic in 6 months with Sarah  Laboratory evaluation by primary care physician suggested TSH, B12, CMP, CBC for evaluation of mild cognitive impairment   DIAGNOSTIC DATA (LABS, IMAGING, TESTING) - I reviewed patient records, labs, notes, testing and imaging myself where available. Reported normal MRI of the brain from [facility] in 2020   HISTORY OF PRESENT ILLNESS:  Matthew Waddington Jonesis a 70 year old male, seen in request by his primary care physician [name], [name] for evaluation of possible Parkinson's disease, he is accompanied by his wife Matthew Graham, initial evaluation was on September 11, 2018.  Past medical history  Hypertension Hyperlipidemia,  diabetes,   He stiill works part-time job, delivery of medicine for [employer]  Around early 2020, he was noted to have gradual onset right hand tremor, change of walking posturing, leaning forward, he also noticed mild rigidity of right leg, difficulty making rapid rhythmic movements with his right foot  He does have couple years history of gradually decreased sense of smell, he denies REM sleep disorder, when he bends down and gets up quickly, he sometime has lightheadedness sensation.   MRI of brain at [facility] was normal in Feb 2020. laboratory evaluations was done in February  UPDATE Dec 18 2018: He is tolerating Requip 1 mginstead of3 times a day, he only take 1 mg twice a day, he denies significant side effect, he still work part-time job driving cars delivering medications, hedoes notice some improvement, he can move better  We personally reviewed MRI of the brain, mild supratentorium small vessel disease,  there was no acute abnormality  UPDATE April 11th 2022: He often only took Sinemet 25/100 + Requip 2mg  twice a day instead of three times a day, he still works part time, driving and delivering medications.  He takes frequent nap during the day, difficulty sleeping at nighttime some times,  He still works part-time 3 days a week, 9 AM to 4:30 PM, driving and delivering medications, during those days, he often misses second dose in the middle of the day, he denies wearing off  He is not as active in the days that he does not go to work,  He also reported pseudobulbar affect, which I witnessed 1 episode today, during MoCA examination, he become laughing and then quickly becomes tearful, patient reported that when he was challenged, he felt frustrated, the response is often out of proportion to how he feels, he denies significant depression,  He complains of mild memory loss, denies difficulty handling daily activity, MoCA examination 28/30 today  PHYSICAL EXAM  Vitals:   06/20/20 0732  BP: 131/68  Pulse: 68  Weight: 156 lb 8 oz (71 kg)  Height: 5\' 9"  (1.753 m)   Body mass index is 23.11 kg/m.  PHYSICAL EXAMNIATION:  Gen: NAD, conversant, well nourised, well groomed                     Cardiovascular: Regular rate rhythm, no peripheral edema, warm, nontender. Eyes: Conjunctivae clear without exudates or hemorrhage Neck: Supple, no carotid bruits. Pulmonary: Clear to auscultation bilaterally   NEUROLOGICAL EXAM:  MENTAL STATUS: Montreal Cognitive Assessment  06/20/2020  Visuospatial/ Executive (0/5) 5  Naming (0/3) 3  Attention: Read list of digits (0/2) 2  Attention: Read list of letters (0/1) 1  Attention: Serial 7 subtraction starting at 100 (0/3) 3  Language: Repeat phrase (0/2) 2  Language : Fluency (0/1) 1  Abstraction (0/2) 2  Delayed Recall (0/5) 3  Orientation (0/6) 6  Total 28     CRANIAL NERVES: CN II: Visual fields are full to confrontation.  Pupils are  round equal and briskly reactive to light. CN III, IV, VI: extraocular movement are normal. No ptosis. CN V: Facial sensation is intact to light touch. CN VII: Face is symmetric with normal eye closure and smile. CN VIII: Hearing is normal to casual conversation CN IX, X: Palate elevates symmetrically. Phonation is normal. CN XI: Head turning and shoulder shrug are intact CN XII: Tongue is midline with normal movements and no atrophy.  MOTOR: Normal strength, no significant resting tremor, right more than left rigidity, bradykinesia, increased with reinforcement maneuver  REFLEXES: Reflexes are 1 and symmetric at the biceps, triceps, knees and ankles. Plantar responses are flexor.  SENSORY: Intact to light touch   COORDINATION: There is no trunk or limb ataxia.    GAIT/STANCE: Push on chair arm to get up, decreased right arm swing, leaning forward, mildly decreased stride, steady   REVIEW OF SYSTEMS: Out of a complete 14 system review of symptoms, the patient complains only of the following symptoms, and all other reviewed was as above.  ALLERGIES: Allergies  Allergen Reactions  . Lisinopril Nausea Only  . Other     Poliovax caused fatigue.    HOME MEDICATIONS: Outpatient Medications Prior to Visit  Medication Sig Dispense Refill  . amLODipine (NORVASC) 5 MG tablet Take 5 mg by mouth daily.    Marland Kitchen atorvastatin (LIPITOR) 10 MG tablet Take 10 mg by mouth daily.    . carbidopa-levodopa (SINEMET) 25-100 MG tablet Take 1 tablet by mouth 3 (three) times daily. 270 tablet 3  . irbesartan (AVAPRO) 300 MG tablet Take 300 mg by mouth daily.     . Polyvinyl Alcohol-Povidone (REFRESH OP) Apply 1 drop to eye as needed (dry eye).     Marland Kitchen rOPINIRole (REQUIP) 2 MG tablet Take 1 tablet (2 mg total) by mouth 3 (three) times daily. 90 tablet 11   No facility-administered medications prior to visit.    PAST MEDICAL HISTORY: Past Medical History:  Diagnosis Date  . Abnormal gait   .  Diabetes (HCC)   . Hypercholesteremia   . Hypertension   . Parkinson disease (HCC)   . Seasonal allergies     PAST SURGICAL HISTORY: Past Surgical History:  Procedure Laterality Date  . APPENDECTOMY    . CHOLECYSTECTOMY    . COLONOSCOPY     normal screening  . COLONOSCOPY N/A 05/27/2019   Procedure: COLONOSCOPY;  Surgeon: Corbin Ade, MD;  Location: [facility];  Service: Endoscopy;  Laterality: N/A;  8:15am  . HERNIA REPAIR      FAMILY HISTORY: Family History  Problem Relation Age of Onset  . Alzheimer's disease Mother   . Diabetes Mother   . Hypertension Mother   . Other Father        potential complications from hip surgery  . Colon cancer Neg Hx     SOCIAL HISTORY: Social History   Socioeconomic History  . Marital status: Married    Spouse name: Matthew Graham  . Number of children: 2  . Years of education: 35  . Highest education level: High  school graduate  Occupational History  . Occupation: Retired  Tobacco Use  . Smoking status: Never Smoker  . Smokeless tobacco: Never Used  Vaping Use  . Vaping Use: Never used  Substance and Sexual Activity  . Alcohol use: Not Currently  . Drug use: Never  . Sexual activity: Not on file  Other Topics Concern  . Not on file  Social History Narrative   Lives at home with wife.    Right-handed.   Rare caffeine use.   Social Determinants of Health   Financial Resource Strain: Not on file  Food Insecurity: Not on file  Transportation Needs: Not on file  Physical Activity: Not on file  Stress: Not on file  Social Connections: Not on file  Intimate Partner Violence: Not on file   Matthew Graham, M.D. Ph.D.  [facility] [address] [place], [place] [address] Phone: [phone] Fax:      [phone]

## 2020-06-23 DIAGNOSIS — G20 Parkinson's disease: Secondary | ICD-10-CM | POA: Diagnosis not present

## 2020-06-23 DIAGNOSIS — I10 Essential (primary) hypertension: Secondary | ICD-10-CM | POA: Diagnosis not present

## 2020-06-23 DIAGNOSIS — E1165 Type 2 diabetes mellitus with hyperglycemia: Secondary | ICD-10-CM | POA: Diagnosis not present

## 2020-06-23 DIAGNOSIS — Z299 Encounter for prophylactic measures, unspecified: Secondary | ICD-10-CM | POA: Diagnosis not present

## 2020-06-23 DIAGNOSIS — F482 Pseudobulbar affect: Secondary | ICD-10-CM | POA: Diagnosis not present

## 2020-06-23 DIAGNOSIS — Z789 Other specified health status: Secondary | ICD-10-CM | POA: Diagnosis not present

## 2020-07-04 DIAGNOSIS — M79672 Pain in left foot: Secondary | ICD-10-CM | POA: Diagnosis not present

## 2020-07-04 DIAGNOSIS — M79671 Pain in right foot: Secondary | ICD-10-CM | POA: Diagnosis not present

## 2020-07-04 DIAGNOSIS — L110 Acquired keratosis follicularis: Secondary | ICD-10-CM | POA: Diagnosis not present

## 2020-07-04 DIAGNOSIS — I739 Peripheral vascular disease, unspecified: Secondary | ICD-10-CM | POA: Diagnosis not present

## 2020-07-04 DIAGNOSIS — E1140 Type 2 diabetes mellitus with diabetic neuropathy, unspecified: Secondary | ICD-10-CM | POA: Diagnosis not present

## 2020-09-16 DIAGNOSIS — Z20822 Contact with and (suspected) exposure to covid-19: Secondary | ICD-10-CM | POA: Diagnosis not present

## 2020-09-19 DIAGNOSIS — M79671 Pain in right foot: Secondary | ICD-10-CM | POA: Diagnosis not present

## 2020-09-19 DIAGNOSIS — E1140 Type 2 diabetes mellitus with diabetic neuropathy, unspecified: Secondary | ICD-10-CM | POA: Diagnosis not present

## 2020-09-19 DIAGNOSIS — I739 Peripheral vascular disease, unspecified: Secondary | ICD-10-CM | POA: Diagnosis not present

## 2020-09-19 DIAGNOSIS — M79672 Pain in left foot: Secondary | ICD-10-CM | POA: Diagnosis not present

## 2020-09-19 DIAGNOSIS — L110 Acquired keratosis follicularis: Secondary | ICD-10-CM | POA: Diagnosis not present

## 2020-09-27 DIAGNOSIS — G20 Parkinson's disease: Secondary | ICD-10-CM | POA: Diagnosis not present

## 2020-09-27 DIAGNOSIS — Z299 Encounter for prophylactic measures, unspecified: Secondary | ICD-10-CM | POA: Diagnosis not present

## 2020-09-27 DIAGNOSIS — I10 Essential (primary) hypertension: Secondary | ICD-10-CM | POA: Diagnosis not present

## 2020-09-27 DIAGNOSIS — E1165 Type 2 diabetes mellitus with hyperglycemia: Secondary | ICD-10-CM | POA: Diagnosis not present

## 2020-10-21 DIAGNOSIS — Z0000 Encounter for general adult medical examination without abnormal findings: Secondary | ICD-10-CM | POA: Diagnosis not present

## 2020-10-21 DIAGNOSIS — Z299 Encounter for prophylactic measures, unspecified: Secondary | ICD-10-CM | POA: Diagnosis not present

## 2020-10-21 DIAGNOSIS — Z79899 Other long term (current) drug therapy: Secondary | ICD-10-CM | POA: Diagnosis not present

## 2020-10-21 DIAGNOSIS — R5383 Other fatigue: Secondary | ICD-10-CM | POA: Diagnosis not present

## 2020-10-21 DIAGNOSIS — Z7189 Other specified counseling: Secondary | ICD-10-CM | POA: Diagnosis not present

## 2020-10-21 DIAGNOSIS — E7800 Pure hypercholesterolemia, unspecified: Secondary | ICD-10-CM | POA: Diagnosis not present

## 2020-10-21 DIAGNOSIS — Z1339 Encounter for screening examination for other mental health and behavioral disorders: Secondary | ICD-10-CM | POA: Diagnosis not present

## 2020-10-21 DIAGNOSIS — Z6822 Body mass index (BMI) 22.0-22.9, adult: Secondary | ICD-10-CM | POA: Diagnosis not present

## 2020-10-21 DIAGNOSIS — I10 Essential (primary) hypertension: Secondary | ICD-10-CM | POA: Diagnosis not present

## 2020-10-21 DIAGNOSIS — Z125 Encounter for screening for malignant neoplasm of prostate: Secondary | ICD-10-CM | POA: Diagnosis not present

## 2020-10-21 DIAGNOSIS — Z1331 Encounter for screening for depression: Secondary | ICD-10-CM | POA: Diagnosis not present

## 2020-10-28 ENCOUNTER — Other Ambulatory Visit: Payer: Self-pay | Admitting: Neurology

## 2020-11-16 DIAGNOSIS — Z20828 Contact with and (suspected) exposure to other viral communicable diseases: Secondary | ICD-10-CM | POA: Diagnosis not present

## 2020-12-19 ENCOUNTER — Ambulatory Visit: Payer: Medicare Other | Admitting: Neurology

## 2021-01-02 DIAGNOSIS — E1165 Type 2 diabetes mellitus with hyperglycemia: Secondary | ICD-10-CM | POA: Diagnosis not present

## 2021-01-02 DIAGNOSIS — G20 Parkinson's disease: Secondary | ICD-10-CM | POA: Diagnosis not present

## 2021-01-02 DIAGNOSIS — H109 Unspecified conjunctivitis: Secondary | ICD-10-CM | POA: Diagnosis not present

## 2021-01-02 DIAGNOSIS — J069 Acute upper respiratory infection, unspecified: Secondary | ICD-10-CM | POA: Diagnosis not present

## 2021-01-02 DIAGNOSIS — Z299 Encounter for prophylactic measures, unspecified: Secondary | ICD-10-CM | POA: Diagnosis not present

## 2021-01-06 DIAGNOSIS — Z23 Encounter for immunization: Secondary | ICD-10-CM | POA: Diagnosis not present

## 2021-01-06 DIAGNOSIS — Z20828 Contact with and (suspected) exposure to other viral communicable diseases: Secondary | ICD-10-CM | POA: Diagnosis not present

## 2021-01-17 DIAGNOSIS — E1140 Type 2 diabetes mellitus with diabetic neuropathy, unspecified: Secondary | ICD-10-CM | POA: Diagnosis not present

## 2021-01-17 DIAGNOSIS — M79671 Pain in right foot: Secondary | ICD-10-CM | POA: Diagnosis not present

## 2021-01-17 DIAGNOSIS — I739 Peripheral vascular disease, unspecified: Secondary | ICD-10-CM | POA: Diagnosis not present

## 2021-01-17 DIAGNOSIS — M79672 Pain in left foot: Secondary | ICD-10-CM | POA: Diagnosis not present

## 2021-01-17 DIAGNOSIS — L110 Acquired keratosis follicularis: Secondary | ICD-10-CM | POA: Diagnosis not present

## 2021-01-20 DIAGNOSIS — Z20828 Contact with and (suspected) exposure to other viral communicable diseases: Secondary | ICD-10-CM | POA: Diagnosis not present

## 2021-01-24 ENCOUNTER — Other Ambulatory Visit: Payer: Self-pay | Admitting: Neurology

## 2021-03-30 ENCOUNTER — Ambulatory Visit (INDEPENDENT_AMBULATORY_CARE_PROVIDER_SITE_OTHER): Payer: Medicare Other | Admitting: Neurology

## 2021-03-30 ENCOUNTER — Encounter: Payer: Self-pay | Admitting: Neurology

## 2021-03-30 VITALS — BP 154/79 | HR 82 | Ht 69.0 in | Wt 150.5 lb

## 2021-03-30 DIAGNOSIS — G3184 Mild cognitive impairment, so stated: Secondary | ICD-10-CM | POA: Diagnosis not present

## 2021-03-30 DIAGNOSIS — F482 Pseudobulbar affect: Secondary | ICD-10-CM | POA: Diagnosis not present

## 2021-03-30 DIAGNOSIS — G20 Parkinson's disease: Secondary | ICD-10-CM | POA: Diagnosis not present

## 2021-03-30 MED ORDER — CARBIDOPA-LEVODOPA 25-100 MG PO TABS: 2.0000 | ORAL_TABLET | Freq: Three times a day (TID) | ORAL | 3 refills | Status: DC

## 2021-03-30 NOTE — Patient Instructions (Addendum)
Stop Requip  Increase Sinemet 25/100 2 tabs three times a day.  6am, 12, 6 pm

## 2021-03-30 NOTE — Progress Notes (Signed)
ASSESSMENT AND PLAN 71 y.o. year old male  Parkinson's disease Pseudobulbar affect Mild cognitive impairment Constipation Dizziness  Slow worsening over time, mild cognitive impairment, excessive daytime sleepiness,  Will stop Requip 2 mg 3 times a day, titrating Sinemet 25/100 mg 2 tablets twice a day  Encourage moderate exercise  Increase water intake  DIAGNOSTIC DATA (LABS, IMAGING, TESTING) - I reviewed patient records, labs, notes, testing and imaging myself where available. Reported normal MRI of the brain from [facility] in 2020   HISTORY OF PRESENT ILLNESS:  Matthew Graham is a 71 year old male, seen in request by his primary care physician [name], [name] for evaluation of possible Parkinson's disease, he is accompanied by his wife Matthew Graham, initial evaluation was on September 11, 2018.   Past medical history  Hypertension Hyperlipidemia,  diabetes,   He stiill works part-time job, delivery of medicine for [employer]   Around early 2020, he was noted to have gradual onset right hand tremor, change of walking posturing, leaning forward, he also noticed mild rigidity of right leg, difficulty making rapid rhythmic movements with his right foot   He does have couple years history of gradually decreased sense of smell, he denies REM sleep disorder, when he bends down and gets up quickly, he sometime has lightheadedness sensation.    MRI of brain at [facility] was normal in Feb 2020. laboratory evaluations was done in February   UPDATE Dec 18 2018: He is tolerating Requip 1 mg instead of 3 times a day, he only take 1 mg twice a day, he denies significant side effect, he still work part-time job driving cars delivering medications, he does notice some improvement, he can move better   We personally reviewed MRI of the brain, mild supratentorium small vessel disease, there was no acute abnormality  UPDATE April 11th 2022: He often only took Sinemet 25/100 +  Requip 2mg  twice a day instead of three times a day, he still works part time, driving and delivering medications.  He takes frequent nap during the day, difficulty sleeping at nighttime some times,  He still works part-time 3 days a week, 9 AM to 4:30 PM, driving and delivering medications, during those days, he often misses second dose in the middle of the day, he denies wearing off  He is not as active in the days that he does not go to work,  He also reported pseudobulbar affect, which I witnessed 1 episode today, during MoCA examination, he become laughing and then quickly becomes tearful, patient reported that when he was challenged, he felt frustrated, the response is often out of proportion to how he feels, he denies significant depression,  He complains of mild memory loss, denies difficulty handling daily activity, MoCA examination 28/30 today  UPDATE Mar 30 2021: He is accompanied by his wife at today's visit, slow worsening over time, increased constipation, orthostatic dizziness, drooling, frequent awakening at nighttime,  He still delivers medicine for local pharmacy, drive, also wall climb up stairs denies difficulties  PHYSICAL EXAM  Vitals:   06/20/20 0732  BP: 131/68  Pulse: 68  Weight: 156 lb 8 oz (71 kg)  Height: 5\' 9"  (1.753 m)   Body mass index is 23.11 kg/m.  PHYSICAL EXAMNIATION:  Gen: NAD, conversant, well nourised, well groomed                     Cardiovascular: Regular rate rhythm, no peripheral edema, warm, nontender. Eyes: Conjunctivae  clear without exudates or hemorrhage Neck: Supple, no carotid bruits. Pulmonary: Clear to auscultation bilaterally   NEUROLOGICAL EXAM:  MENTAL STATUS: Mild slurred wet voice, Montreal Cognitive Assessment  06/20/2020  Visuospatial/ Executive (0/5) 5  Naming (0/3) 3  Attention: Read list of digits (0/2) 2  Attention: Read list of letters (0/1) 1  Attention: Serial 7 subtraction starting at 100 (0/3) 3   Language: Repeat phrase (0/2) 2  Language : Fluency (0/1) 1  Abstraction (0/2) 2  Delayed Recall (0/5) 3  Orientation (0/6) 6  Total 28     CRANIAL NERVES: CN II: Visual fields are full to confrontation.  Pupils are round equal and briskly reactive to light. CN III, IV, VI: extraocular movement are normal. No ptosis. CN V: Facial sensation is intact to light touch. CN VII: Face is symmetric with normal eye closure and smile. CN VIII: Hearing is normal to casual conversation CN IX, X: Palate elevates symmetrically. Phonation is normal. CN XI: Head turning and shoulder shrug are intact CN XII: Tongue is midline with normal movements and no atrophy.  MOTOR: Normal strength, no significant resting tremor, right more than left rigidity, bradykinesia, increased with reinforcement maneuver  REFLEXES: Reflexes are 1 and symmetric at the biceps, triceps, knees and ankles. Plantar responses are flexor.  SENSORY: Intact to light touch   COORDINATION: There is no trunk or limb ataxia.    GAIT/STANCE: Push on chair arm to get up, decreased right arm swing, leaning forward, mildly decreased stride, steady   REVIEW OF SYSTEMS: Out of a complete 14 system review of symptoms, the patient complains only of the following symptoms, and all other reviewed was as above.  ALLERGIES: Allergies  Allergen Reactions   Lisinopril Nausea Only   Other     Poliovax caused fatigue.    HOME MEDICATIONS: Outpatient Medications Prior to Visit  Medication Sig Dispense Refill   amLODipine (NORVASC) 5 MG tablet Take 5 mg by mouth daily.     atorvastatin (LIPITOR) 10 MG tablet Take 10 mg by mouth daily.     carbidopa-levodopa (SINEMET IR) 25-100 MG tablet TAKE ONE TABLET BY MOUTH THREE TIMES DAILY 270 tablet 3   irbesartan (AVAPRO) 300 MG tablet Take 300 mg by mouth daily.      metFORMIN (GLUCOPHAGE-XR) 500 MG 24 hr tablet Take 500 mg by mouth daily.     Polyvinyl Alcohol-Povidone (REFRESH OP) Apply  1 drop to eye as needed (dry eye).      rOPINIRole (REQUIP) 2 MG tablet TAKE ONE TABLET BY MOUTH THREE TIMES DAILY 270 tablet 0   vitamin B-12 (CYANOCOBALAMIN) 1000 MCG tablet Take 1,000 mcg by mouth daily.     No facility-administered medications prior to visit.    PAST MEDICAL HISTORY: Past Medical History:  Diagnosis Date   Abnormal gait    Diabetes (HCC)    Hypercholesteremia    Hypertension    Parkinson disease (HCC)    Seasonal allergies     PAST SURGICAL HISTORY: Past Surgical History:  Procedure Laterality Date   APPENDECTOMY     CHOLECYSTECTOMY     COLONOSCOPY     normal screening   COLONOSCOPY N/A 05/27/2019   Procedure: COLONOSCOPY;  Surgeon: Corbin Ade, MD;  [facility];  Service: Endoscopy;  Laterality: N/A;  8:15am   HERNIA REPAIR      FAMILY HISTORY: Family History  Problem Relation Age of Onset   Alzheimer's disease Mother    Diabetes Mother  Hypertension Mother    Other Father        potential complications from hip surgery   Colon cancer Neg Hx     SOCIAL HISTORY: Social History   Socioeconomic History   Marital status: Married    Spouse name: Matthew LundJanet   Number of children: 2   Years of education: 12   Highest education level: High school graduate  Occupational History   Occupation: Retired  Tobacco Use   Smoking status: Never   Smokeless tobacco: Never  Vaping Use   Vaping Use: Never used  Substance and Sexual Activity   Alcohol use: Not Currently   Drug use: Never   Sexual activity: Not on file  Other Topics Concern   Not on file  Social History Narrative   Lives at home with wife.    Right-handed.   Rare caffeine use.   Social Determinants of Health   Financial Resource Strain: Not on file  Food Insecurity: Not on file  Transportation Needs: Not on file  Physical Activity: Not on file  Stress: Not on file  Social Connections: Not on file  Intimate Partner Violence: Not on file   Levert FeinsteinYijun Vivyan Biggers, M.D.  Ph.D.  [facility] [address] [place], [place] [address] Phone: [phone] Fax:      [phone]

## 2021-04-06 DIAGNOSIS — Z789 Other specified health status: Secondary | ICD-10-CM | POA: Diagnosis not present

## 2021-04-06 DIAGNOSIS — Z299 Encounter for prophylactic measures, unspecified: Secondary | ICD-10-CM | POA: Diagnosis not present

## 2021-04-06 DIAGNOSIS — G20 Parkinson's disease: Secondary | ICD-10-CM | POA: Diagnosis not present

## 2021-04-06 DIAGNOSIS — I10 Essential (primary) hypertension: Secondary | ICD-10-CM | POA: Diagnosis not present

## 2021-04-06 DIAGNOSIS — E1165 Type 2 diabetes mellitus with hyperglycemia: Secondary | ICD-10-CM | POA: Diagnosis not present

## 2021-04-06 DIAGNOSIS — R1033 Periumbilical pain: Secondary | ICD-10-CM | POA: Diagnosis not present

## 2021-04-25 DIAGNOSIS — E1140 Type 2 diabetes mellitus with diabetic neuropathy, unspecified: Secondary | ICD-10-CM | POA: Diagnosis not present

## 2021-04-25 DIAGNOSIS — I739 Peripheral vascular disease, unspecified: Secondary | ICD-10-CM | POA: Diagnosis not present

## 2021-04-25 DIAGNOSIS — M79671 Pain in right foot: Secondary | ICD-10-CM | POA: Diagnosis not present

## 2021-04-25 DIAGNOSIS — M79672 Pain in left foot: Secondary | ICD-10-CM | POA: Diagnosis not present

## 2021-04-25 DIAGNOSIS — L110 Acquired keratosis follicularis: Secondary | ICD-10-CM | POA: Diagnosis not present

## 2021-05-10 DIAGNOSIS — Z20822 Contact with and (suspected) exposure to covid-19: Secondary | ICD-10-CM | POA: Diagnosis not present

## 2021-06-27 DIAGNOSIS — Z20822 Contact with and (suspected) exposure to covid-19: Secondary | ICD-10-CM | POA: Diagnosis not present

## 2021-07-04 DIAGNOSIS — Z20828 Contact with and (suspected) exposure to other viral communicable diseases: Secondary | ICD-10-CM | POA: Diagnosis not present

## 2021-07-11 DIAGNOSIS — S01112A Laceration without foreign body of left eyelid and periocular area, initial encounter: Secondary | ICD-10-CM | POA: Diagnosis not present

## 2021-08-03 DIAGNOSIS — I10 Essential (primary) hypertension: Secondary | ICD-10-CM | POA: Diagnosis not present

## 2021-08-03 DIAGNOSIS — Z299 Encounter for prophylactic measures, unspecified: Secondary | ICD-10-CM | POA: Diagnosis not present

## 2021-08-03 DIAGNOSIS — R350 Frequency of micturition: Secondary | ICD-10-CM | POA: Diagnosis not present

## 2021-08-03 DIAGNOSIS — E1165 Type 2 diabetes mellitus with hyperglycemia: Secondary | ICD-10-CM | POA: Diagnosis not present

## 2021-08-03 DIAGNOSIS — R64 Cachexia: Secondary | ICD-10-CM | POA: Diagnosis not present

## 2021-08-09 DIAGNOSIS — L110 Acquired keratosis follicularis: Secondary | ICD-10-CM | POA: Diagnosis not present

## 2021-08-09 DIAGNOSIS — M79672 Pain in left foot: Secondary | ICD-10-CM | POA: Diagnosis not present

## 2021-08-09 DIAGNOSIS — M79671 Pain in right foot: Secondary | ICD-10-CM | POA: Diagnosis not present

## 2021-08-09 DIAGNOSIS — I739 Peripheral vascular disease, unspecified: Secondary | ICD-10-CM | POA: Diagnosis not present

## 2021-08-09 DIAGNOSIS — E1140 Type 2 diabetes mellitus with diabetic neuropathy, unspecified: Secondary | ICD-10-CM | POA: Diagnosis not present

## 2021-09-07 IMAGING — RF DG SWALLOWING FUNCTION
13 of 24 series · 13 of 24 positions shown · non-contrast
Comparison: None.

CLINICAL DATA: Dysphagia. Cough/GE reflux disease/other secondary
diagnosis

EXAM:
MODIFIED BARIUM SWALLOW
TECHNIQUE: Different consistencies of barium were administered orally to the
patient by the Speech Pathologist. Imaging of the pharynx was
performed in the lateral projection. The radiologist was present in
the fluoroscopy room for this study, providing personal supervision.
FLUOROSCOPY TIME:  Fluoroscopy Time:  3 minutes and 36 seconds
Radiation Exposure Index (if provided by the fluoroscopic device):
25.3 mGy

[Series 1: before po · 1 of 57 frames shown]
[frame 9/57]
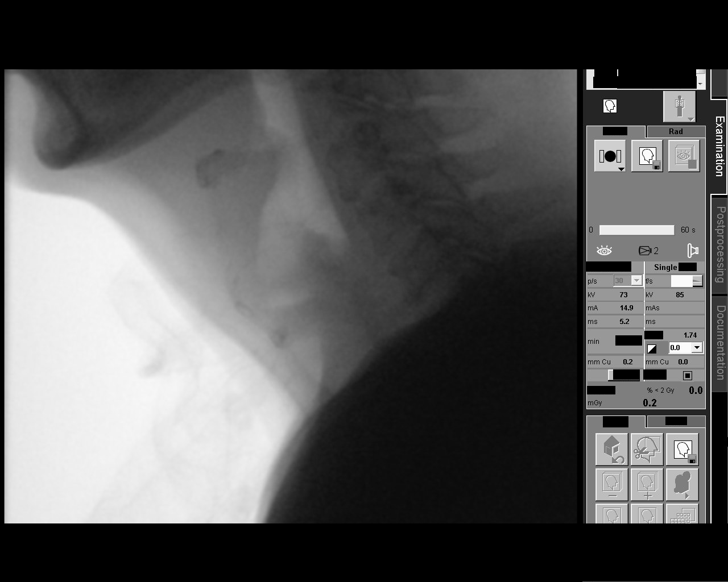

[Series 3: residuals · 1 of 54 frames shown]
[frame 9/54]
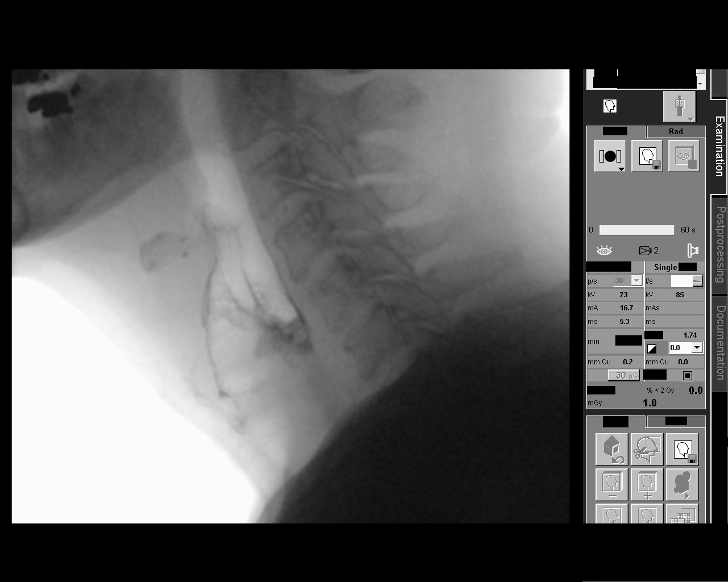

[Series 5: delayed cough after aspiration · 1 of 82 frames shown]
[frame 13/82]
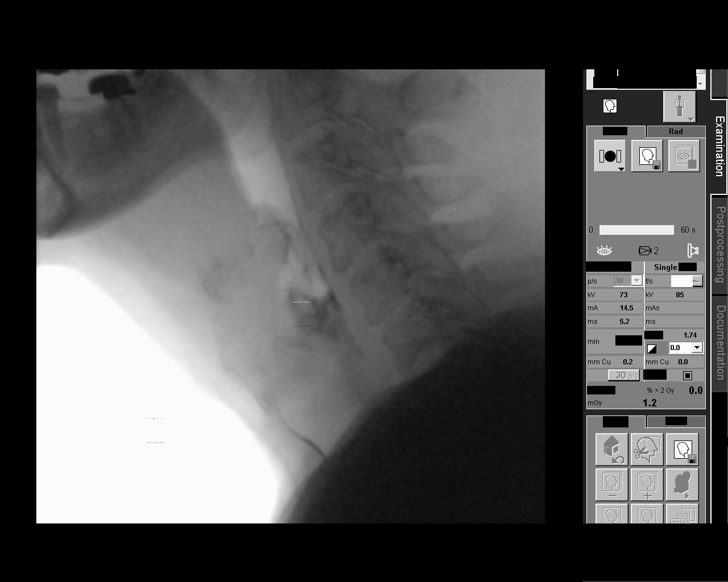

[Series 7: tsp thin 2 · 1 of 199 frames shown]
[frame 50/199]
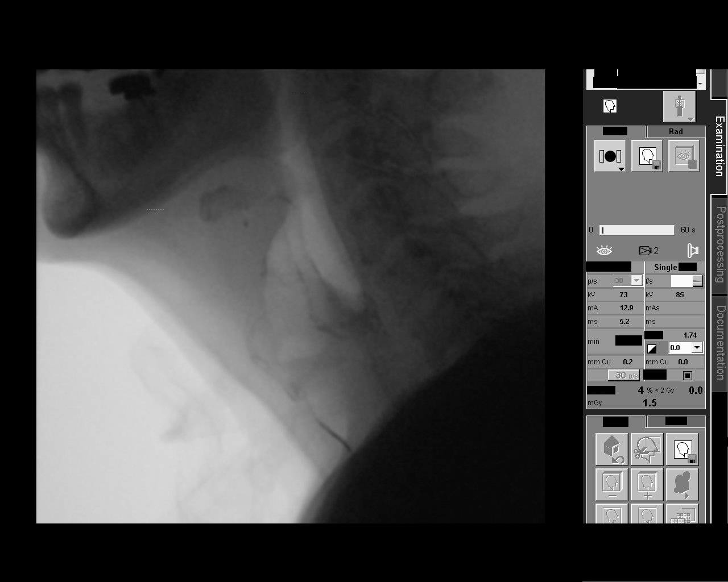

[Series 9: cup thin, say eee · 1 of 418 frames shown]
[frame 63/418]
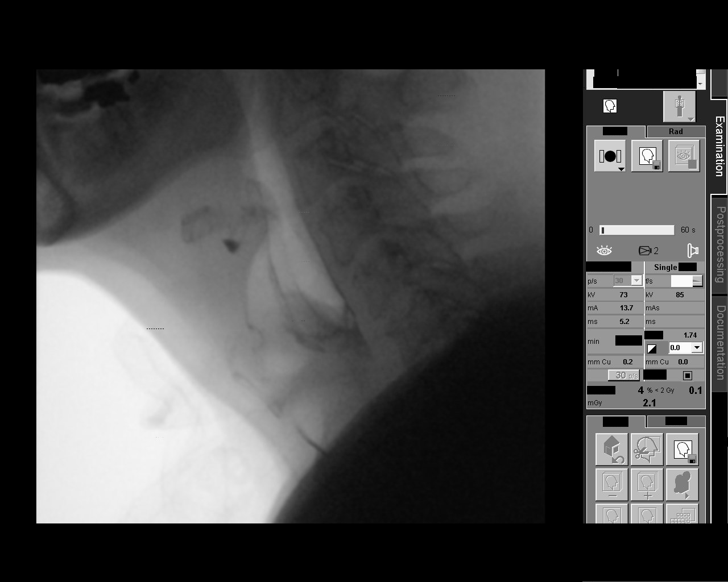

[Series 11: attempt supraglottic swallow · 1 of 561 frames shown]
[frame 281/561]
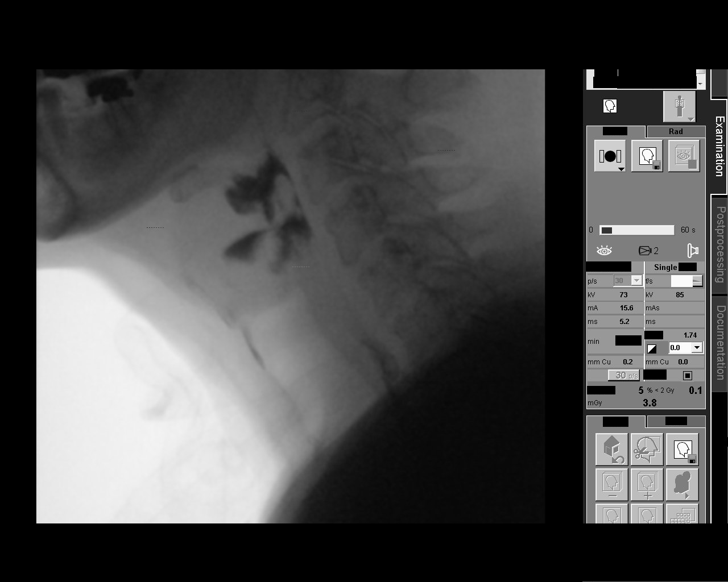

[Series 13: puree with chin tuck, head turn r · 1 of 480 frames shown]
[frame 237/480]
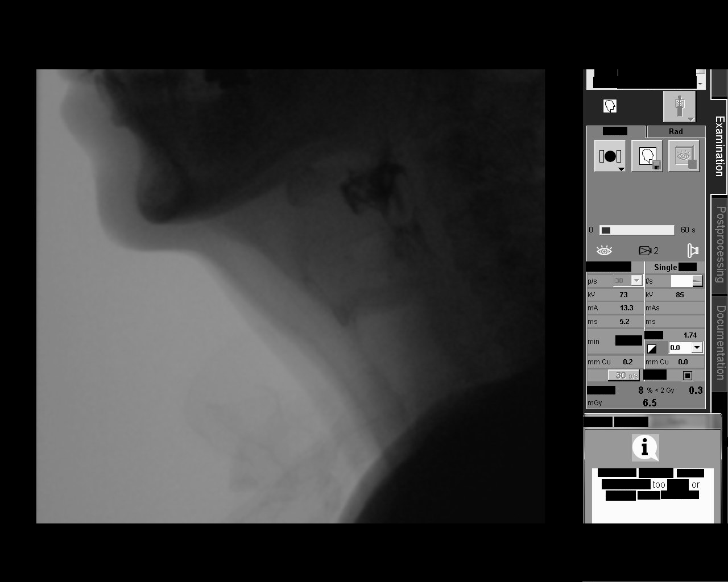

[Series 14: head turn l · 1 of 132 frames shown]
[frame 67/132]
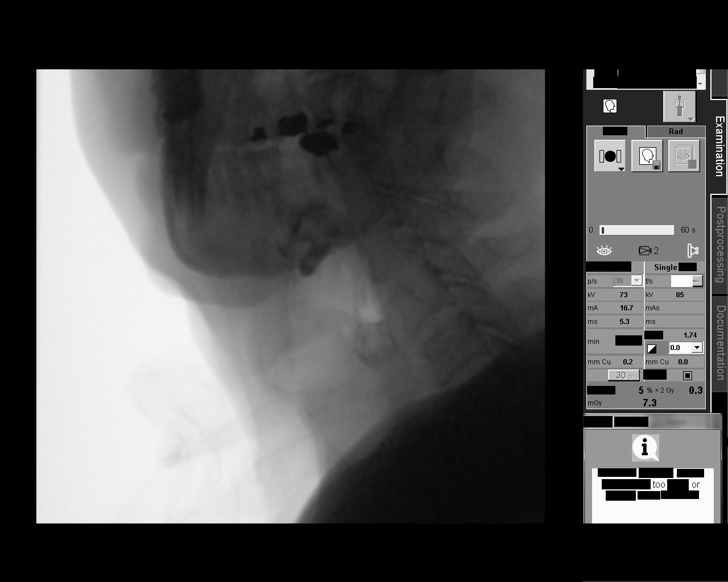

[Series 16: head l swallow puree · 1 of 324 frames shown]
[frame 276/324]
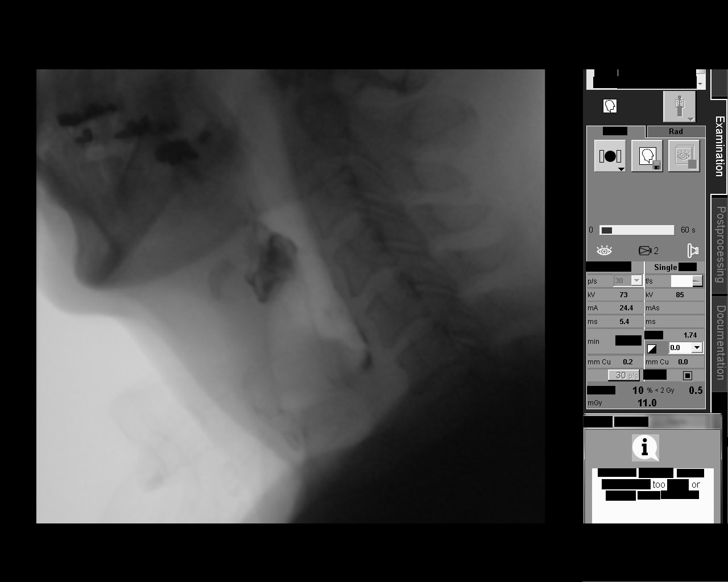

[Series 18: cup thin · 1 of 311 frames shown]
[frame 265/311]
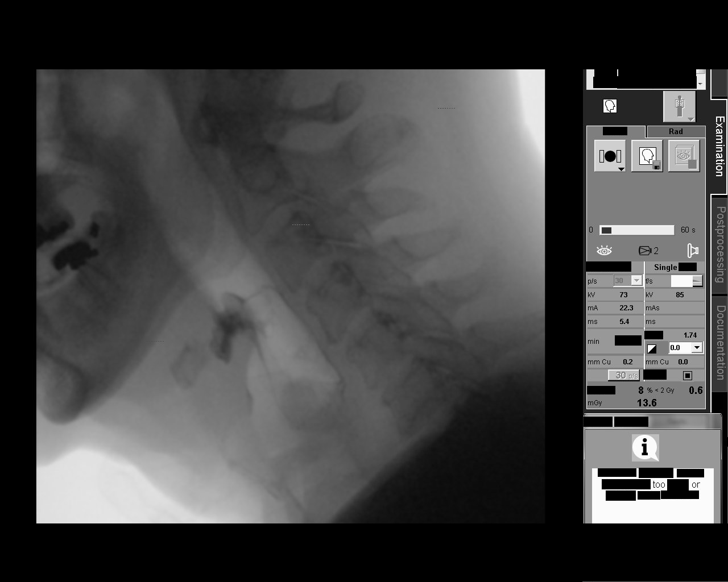

[Series 20: chin tuck cup thin · 1 of 370 frames shown]
[frame 315/370]
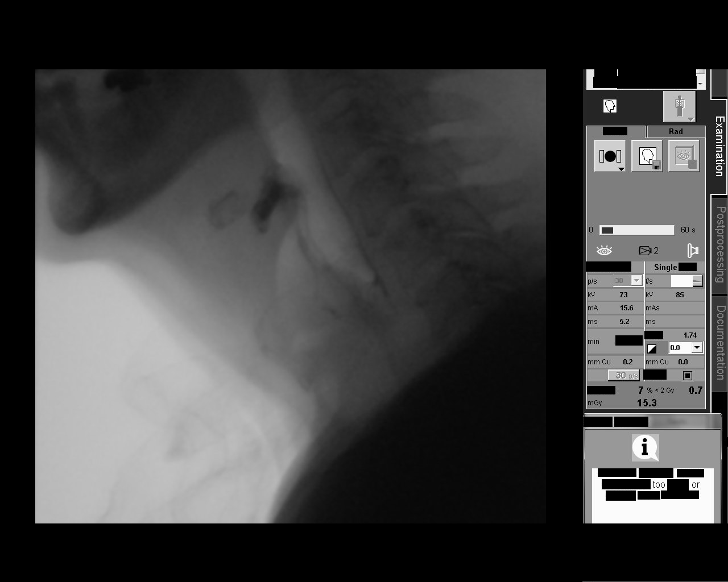

[Series 22: pill with cup thin, cued cough · 1 of 510 frames shown]
[frame 434/510]
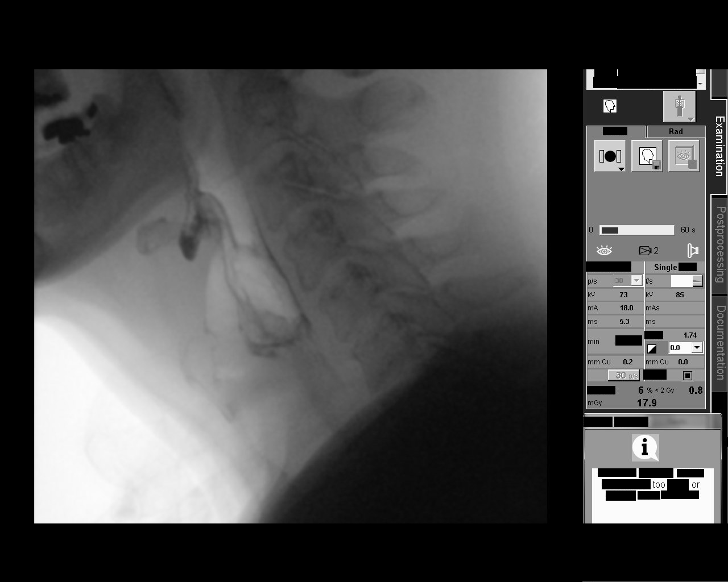

[Series 24: cup ntl with chin tuck · 1 of 234 frames shown]
[frame 206/234]
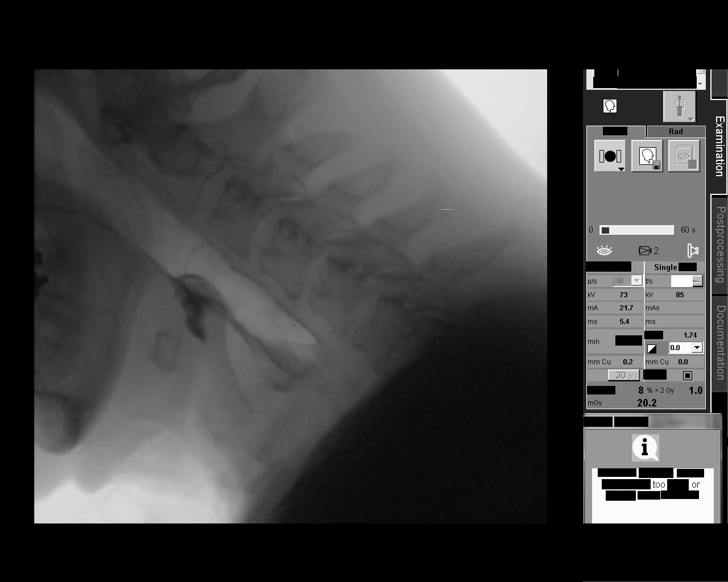

[13 of 24 positions shown; findings below may reference images not displayed]

FINDINGS: /IMPRESSION:
Please refer to the Speech Pathologists report for complete details
and recommendations.

## 2021-09-26 DIAGNOSIS — R079 Chest pain, unspecified: Secondary | ICD-10-CM | POA: Diagnosis not present

## 2021-09-26 DIAGNOSIS — Z789 Other specified health status: Secondary | ICD-10-CM | POA: Diagnosis not present

## 2021-09-26 DIAGNOSIS — G20 Parkinson's disease: Secondary | ICD-10-CM | POA: Diagnosis not present

## 2021-09-26 DIAGNOSIS — Z299 Encounter for prophylactic measures, unspecified: Secondary | ICD-10-CM | POA: Diagnosis not present

## 2021-09-26 DIAGNOSIS — R0789 Other chest pain: Secondary | ICD-10-CM | POA: Diagnosis not present

## 2021-09-26 DIAGNOSIS — R9389 Abnormal findings on diagnostic imaging of other specified body structures: Secondary | ICD-10-CM | POA: Diagnosis not present

## 2021-10-02 NOTE — Progress Notes (Unsigned)
Patient: Matthew Graham Date of Birth: [birth year]  Reason for Visit: Follow up History from: Patient Primary Neurologist:    ASSESSMENT AND PLAN 71 y.o. year old male    HISTORY OF PRESENT ILLNESS: Today 10/02/21  HISTORY  Matthew Graham is a 71 year old male, seen in request by his primary care physician [name], [name] for evaluation of possible Parkinson's disease, he is accompanied by his wife [name], initial evaluation was on September 11, 2018.   Past medical history  Hypertension Hyperlipidemia,  diabetes,    He stiill works part-time job, delivery of medicine for [employer]   Around early 2020, he was noted to have gradual onset right hand tremor, change of walking posturing, leaning forward, he also noticed mild rigidity of right leg, difficulty making rapid rhythmic movements with his right foot   He does have couple years history of gradually decreased sense of smell, he denies REM sleep disorder, when he bends down and gets up quickly, he sometime has lightheadedness sensation.    MRI of brain at [facility] was normal in Feb 2020. laboratory evaluations was done in February   UPDATE Dec 18 2018: He is tolerating Requip 1 mg instead of 3 times a day, he only take 1 mg twice a day, he denies significant side effect, he still work part-time job driving cars delivering medications, he does notice some improvement, he can move better   We personally reviewed MRI of the brain, mild supratentorium small vessel disease, there was no acute abnormality   UPDATE April 11th 2022: He often only took Sinemet 25/100 + Requip 2mg  twice a day instead of three times a day, he still works part time, driving and delivering medications.   He takes frequent nap during the day, difficulty sleeping at nighttime some times,   He still works part-time 3 days a week, 9 AM to 4:30 PM, driving and delivering medications, during those days, he often misses second dose in the  middle of the day, he denies wearing off   He is not as active in the days that he does not go to work,   He also reported pseudobulbar affect, which I witnessed 1 episode today, during MoCA examination, he become laughing and then quickly becomes tearful, patient reported that when he was challenged, he felt frustrated, the response is often out of proportion to how he feels, he denies significant depression,   He complains of mild memory loss, denies difficulty handling daily activity, MoCA examination 28/30 today   UPDATE Mar 30 2021: He is accompanied by his wife at today's visit, slow worsening over time, increased constipation, orthostatic dizziness, drooling, frequent awakening at nighttime,  He still delivers medicine for local pharmacy, drive, also wall climb up stairs denies difficulties  Update 10/03/21 SS:   REVIEW OF SYSTEMS: Out of a complete 14 system review of symptoms, the patient complains only of the following symptoms, and all other reviewed systems are negative.  See HPI  ALLERGIES: Allergies  Allergen Reactions   Lisinopril Nausea Only   Other     Poliovax caused fatigue.    HOME MEDICATIONS: Outpatient Medications Prior to Visit  Medication Sig Dispense Refill   amLODipine (NORVASC) 5 MG tablet Take 5 mg by mouth daily.     atorvastatin (LIPITOR) 10 MG tablet Take 10 mg by mouth daily.     carbidopa-levodopa (SINEMET IR) 25-100 MG tablet Take 2 tablets by mouth 3 (three) times daily. 540 tablet 3  irbesartan (AVAPRO) 300 MG tablet Take 300 mg by mouth daily.      metFORMIN (GLUCOPHAGE-XR) 500 MG 24 hr tablet Take 500 mg by mouth daily.     Polyvinyl Alcohol-Povidone (REFRESH OP) Apply 1 drop to eye as needed (dry eye).      rOPINIRole (REQUIP) 2 MG tablet TAKE ONE TABLET BY MOUTH THREE TIMES DAILY 270 tablet 0   vitamin B-12 (CYANOCOBALAMIN) 1000 MCG tablet Take 1,000 mcg by mouth daily.     No facility-administered medications prior to visit.    PAST  MEDICAL HISTORY: Past Medical History:  Diagnosis Date   Abnormal gait    Diabetes (HCC)    Hypercholesteremia    Hypertension    Parkinson disease (HCC)    Seasonal allergies     PAST SURGICAL HISTORY: Past Surgical History:  Procedure Laterality Date   APPENDECTOMY     CHOLECYSTECTOMY     COLONOSCOPY     normal screening   COLONOSCOPY N/A 05/27/2019   Procedure: COLONOSCOPY;  Surgeon: Corbin Ade, MD;  [facility];  Service: Endoscopy;  Laterality: N/A;  8:15am   HERNIA REPAIR      FAMILY HISTORY: Family History  Problem Relation Age of Onset   Alzheimer's disease Mother    Diabetes Mother    Hypertension Mother    Other Father        potential complications from hip surgery   Colon cancer Neg Hx     SOCIAL HISTORY: Social History   Socioeconomic History   Marital status: Married    Spouse name: [name]   Number of children: 2   Years of education: 12   Highest education level: High school graduate  Occupational History   Occupation: Retired  Tobacco Use   Smoking status: Never   Smokeless tobacco: Never  Vaping Use   Vaping Use: Never used  Substance and Sexual Activity   Alcohol use: Not Currently   Drug use: Never   Sexual activity: Not on file  Other Topics Concern   Not on file  Social History Narrative   Lives at home with wife.    Right-handed.   Rare caffeine use.   Social Determinants of Health   Financial Resource Strain: Not on file  Food Insecurity: Not on file  Transportation Needs: Not on file  Physical Activity: Not on file  Stress: Not on file  Social Connections: Not on file  Intimate Partner Violence: Not on file    PHYSICAL EXAM  There were no vitals filed for this visit. There is no height or weight on file to calculate BMI.  Generalized: Well developed, in no acute distress  Neurological examination  Mentation: Alert oriented to time, place, history taking. Follows all commands speech and language  fluent Cranial nerve II-XII: Pupils were equal round reactive to light. Extraocular movements were full, visual field were full on confrontational test. Facial sensation and strength were normal. Uvula tongue midline. Head turning and shoulder shrug  were normal and symmetric. Motor: The motor testing reveals 5 over 5 strength of all 4 extremities. Good symmetric motor tone is noted throughout.  Sensory: Sensory testing is intact to soft touch on all 4 extremities. No evidence of extinction is noted.  Coordination: Cerebellar testing reveals good finger-nose-finger and heel-to-shin bilaterally.  Gait and station: Gait is normal. Tandem gait is normal. Romberg is negative. No drift is seen.  Reflexes: Deep tendon reflexes are symmetric and normal bilaterally.   DIAGNOSTIC DATA (LABS, IMAGING,  TESTING) - I reviewed patient records, labs, notes, testing and imaging myself where available.  No results found for: "WBC", "HGB", "HCT", "MCV", "PLT" No results found for: "NA", "K", "CL", "CO2", "GLUCOSE", "BUN", "CREATININE", "CALCIUM", "PROT", "ALBUMIN", "AST", "ALT", "ALKPHOS", "BILITOT", "GFRNONAA", "GFRAA" No results found for: "CHOL", "HDL", "[place]", "LDLDIRECT", "TRIG", "CHOLHDL" No results found for: "HGBA1C" No results found for: "VITAMINB12" No results found for: "TSH"  Butler Denmark, AGNP-C, DNP 10/02/2021, 4:29 PM [facility] [address], [place], [place] [address] [phone]

## 2021-10-03 ENCOUNTER — Ambulatory Visit (INDEPENDENT_AMBULATORY_CARE_PROVIDER_SITE_OTHER): Payer: Medicare Other | Admitting: Neurology

## 2021-10-03 ENCOUNTER — Encounter: Payer: Self-pay | Admitting: Neurology

## 2021-10-03 VITALS — BP 145/77 | HR 87 | Ht 69.0 in | Wt 135.5 lb

## 2021-10-03 DIAGNOSIS — G20 Parkinson's disease: Secondary | ICD-10-CM | POA: Diagnosis not present

## 2021-10-03 DIAGNOSIS — G3184 Mild cognitive impairment, so stated: Secondary | ICD-10-CM | POA: Diagnosis not present

## 2021-10-03 MED ORDER — CARBIDOPA-LEVODOPA 25-100 MG PO TABS: 2.0000 | ORAL_TABLET | Freq: Three times a day (TID) | ORAL | 3 refills | Status: DC

## 2021-10-03 NOTE — Patient Instructions (Addendum)
Try taking Sinemet 1 tablet 4 times daily  Monitor weight, you are losing weight Try taking Sinemet 30-60 mins before eating  Consider seeing speech therapy again Implement boost drinks daily  Discuss weight loss with PCP, please evaluate metformin  Call me with weight update in 6-8 weeks  See you back in 5 months with [name]

## 2021-10-26 DIAGNOSIS — M79672 Pain in left foot: Secondary | ICD-10-CM | POA: Diagnosis not present

## 2021-10-26 DIAGNOSIS — M79671 Pain in right foot: Secondary | ICD-10-CM | POA: Diagnosis not present

## 2021-10-26 DIAGNOSIS — E1140 Type 2 diabetes mellitus with diabetic neuropathy, unspecified: Secondary | ICD-10-CM | POA: Diagnosis not present

## 2021-10-26 DIAGNOSIS — I739 Peripheral vascular disease, unspecified: Secondary | ICD-10-CM | POA: Diagnosis not present

## 2021-10-26 DIAGNOSIS — L110 Acquired keratosis follicularis: Secondary | ICD-10-CM | POA: Diagnosis not present

## 2021-11-02 DIAGNOSIS — R5383 Other fatigue: Secondary | ICD-10-CM | POA: Diagnosis not present

## 2021-11-02 DIAGNOSIS — Z681 Body mass index (BMI) 19 or less, adult: Secondary | ICD-10-CM | POA: Diagnosis not present

## 2021-11-02 DIAGNOSIS — Z299 Encounter for prophylactic measures, unspecified: Secondary | ICD-10-CM | POA: Diagnosis not present

## 2021-11-02 DIAGNOSIS — Z7189 Other specified counseling: Secondary | ICD-10-CM | POA: Diagnosis not present

## 2021-11-02 DIAGNOSIS — Z0000 Encounter for general adult medical examination without abnormal findings: Secondary | ICD-10-CM | POA: Diagnosis not present

## 2021-11-02 DIAGNOSIS — Z1331 Encounter for screening for depression: Secondary | ICD-10-CM | POA: Diagnosis not present

## 2021-11-02 DIAGNOSIS — Z79899 Other long term (current) drug therapy: Secondary | ICD-10-CM | POA: Diagnosis not present

## 2021-11-02 DIAGNOSIS — Z1339 Encounter for screening examination for other mental health and behavioral disorders: Secondary | ICD-10-CM | POA: Diagnosis not present

## 2021-11-02 DIAGNOSIS — E7800 Pure hypercholesterolemia, unspecified: Secondary | ICD-10-CM | POA: Diagnosis not present

## 2021-11-06 DIAGNOSIS — Z125 Encounter for screening for malignant neoplasm of prostate: Secondary | ICD-10-CM | POA: Diagnosis not present

## 2021-11-06 DIAGNOSIS — R5383 Other fatigue: Secondary | ICD-10-CM | POA: Diagnosis not present

## 2021-11-06 DIAGNOSIS — E7800 Pure hypercholesterolemia, unspecified: Secondary | ICD-10-CM | POA: Diagnosis not present

## 2021-11-06 DIAGNOSIS — Z79899 Other long term (current) drug therapy: Secondary | ICD-10-CM | POA: Diagnosis not present

## 2021-11-06 DIAGNOSIS — E119 Type 2 diabetes mellitus without complications: Secondary | ICD-10-CM | POA: Diagnosis not present

## 2021-12-04 DIAGNOSIS — Z299 Encounter for prophylactic measures, unspecified: Secondary | ICD-10-CM | POA: Diagnosis not present

## 2021-12-04 DIAGNOSIS — I10 Essential (primary) hypertension: Secondary | ICD-10-CM | POA: Diagnosis not present

## 2021-12-04 DIAGNOSIS — R21 Rash and other nonspecific skin eruption: Secondary | ICD-10-CM | POA: Diagnosis not present

## 2021-12-04 DIAGNOSIS — E7800 Pure hypercholesterolemia, unspecified: Secondary | ICD-10-CM | POA: Diagnosis not present

## 2021-12-11 DIAGNOSIS — R21 Rash and other nonspecific skin eruption: Secondary | ICD-10-CM | POA: Diagnosis not present

## 2021-12-11 DIAGNOSIS — Z299 Encounter for prophylactic measures, unspecified: Secondary | ICD-10-CM | POA: Diagnosis not present

## 2021-12-11 DIAGNOSIS — Z681 Body mass index (BMI) 19 or less, adult: Secondary | ICD-10-CM | POA: Diagnosis not present

## 2021-12-11 DIAGNOSIS — Z713 Dietary counseling and surveillance: Secondary | ICD-10-CM | POA: Diagnosis not present

## 2021-12-11 DIAGNOSIS — E1165 Type 2 diabetes mellitus with hyperglycemia: Secondary | ICD-10-CM | POA: Diagnosis not present

## 2021-12-28 DIAGNOSIS — Z23 Encounter for immunization: Secondary | ICD-10-CM | POA: Diagnosis not present

## 2022-01-11 DIAGNOSIS — M79671 Pain in right foot: Secondary | ICD-10-CM | POA: Diagnosis not present

## 2022-01-11 DIAGNOSIS — L110 Acquired keratosis follicularis: Secondary | ICD-10-CM | POA: Diagnosis not present

## 2022-01-11 DIAGNOSIS — I739 Peripheral vascular disease, unspecified: Secondary | ICD-10-CM | POA: Diagnosis not present

## 2022-01-11 DIAGNOSIS — E1140 Type 2 diabetes mellitus with diabetic neuropathy, unspecified: Secondary | ICD-10-CM | POA: Diagnosis not present

## 2022-01-11 DIAGNOSIS — M79672 Pain in left foot: Secondary | ICD-10-CM | POA: Diagnosis not present

## 2022-02-27 ENCOUNTER — Ambulatory Visit (INDEPENDENT_AMBULATORY_CARE_PROVIDER_SITE_OTHER): Payer: Medicare Other | Admitting: Neurology

## 2022-02-27 ENCOUNTER — Encounter: Payer: Self-pay | Admitting: Neurology

## 2022-02-27 ENCOUNTER — Telehealth: Payer: Self-pay | Admitting: Neurology

## 2022-02-27 VITALS — BP 148/74 | HR 83 | Ht 68.0 in | Wt 131.0 lb

## 2022-02-27 DIAGNOSIS — R531 Weakness: Secondary | ICD-10-CM

## 2022-02-27 DIAGNOSIS — G20A1 Parkinson's disease without dyskinesia, without mention of fluctuations: Secondary | ICD-10-CM | POA: Diagnosis not present

## 2022-02-27 DIAGNOSIS — G3184 Mild cognitive impairment, so stated: Secondary | ICD-10-CM | POA: Diagnosis not present

## 2022-02-27 MED ORDER — OXYBUTYNIN CHLORIDE ER 5 MG PO TB24: 5.0000 mg | ORAL_TABLET | Freq: Every day | ORAL | 6 refills | Status: DC

## 2022-02-27 NOTE — Progress Notes (Signed)
ASSESSMENT AND PLAN 71 y.o. year old male   Idiopathic Parkinson's disease  Under relative good control, taking Sinemet 25/100 mg 2 tablets 3 times a day   Temporal area muscle wasting, soft voice, moderate eye closure, cheek puff weakness, moderate ankle dorsiflexion weakness,  Suggestive of underlying muscular dystrophy,  EMG nerve conduction study  Laboratory evaluation including CPK,  May consider genetic test for myotonic muscular dystrophy panel  Urinary urgency, frequency  Add on oxybutynin ER 5 mg every night  Referred to urology  DIAGNOSTIC DATA (LABS, IMAGING, TESTING) - I reviewed patient records, labs, notes, testing and imaging myself where available.  HISTORY  Matthew Graham is a 71 year old male, seen in request by his primary care physician [name], [name] for evaluation of possible Parkinson's disease, he is accompanied by his wife Matthew Graham, initial evaluation was on September 11, 2018.   Past medical history  Hypertension Hyperlipidemia,  diabetes,    He stiill works part-time job, delivery of medicine for [employer]   Around early 2020, he was noted to have gradual onset right hand tremor, change of walking posturing, leaning forward, he also noticed mild rigidity of right leg, difficulty making rapid rhythmic movements with his right foot   He does have couple years history of gradually decreased sense of smell, he denies REM sleep disorder, when he bends down and gets up quickly, he sometime has lightheadedness sensation.   MRI of brain at [facility] was normal in Feb 2020. laboratory evaluations was done in February   UPDATE Dec 18 2018: He is tolerating Requip 1 mg instead of 3 times a day, he only take 1 mg twice a day, he denies significant side effect, he still work part-time job driving cars delivering medications, he does notice some improvement, he can move better   We personally reviewed MRI of the brain, mild supratentorium small  vessel disease, there was no acute abnormality   UPDATE April 11th 2022: He often only took Sinemet 25/100 + Requip 2mg  twice a day instead of three times a day, he still works part time, driving and delivering medications.   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, which I witnessed 1 episode today, during MoCA examination, he become laughing and then quickly becomes tearful, patient reported that when he was challenged, he felt frustrated, the response is often out of proportion to how he feels, he denies significant depression,   He complains of mild memory loss, denies difficulty handling daily activity, MoCA examination 28/30 today   UPDATE Mar 30 2021: He is accompanied by his wife at today's visit, slow worsening over time, increased constipation, orthostatic dizziness, drooling, frequent awakening at nighttime,  He still delivers medicine for local pharmacy, drive, also wall climb up stairs denies difficulties  UPDATE Feb 27 2022: He is accompanied by his wife at today's visit, complains of weak cough, difficulty clearing his throat, thick secretions to the point of difficulty eating sometimes, reported fair appetite, but has trouble keeping a regular sleep pattern, tends to use falling to sleep watching TV in his recliner,  then get up using bathroom, by the time that he went upstairs, he was wide-awake, tosses and turns in the bed, difficulty falling to sleep again,  Also complains of frequent awakening at nighttime, using bathroom, small-volume,   REVIEW OF SYSTEMS: Out of a complete 14 system review of symptoms, the patient complains only of the  following symptoms, and all other reviewed systems are negative.  See HPI  PHYSICAL EXAM  Vitals:   02/27/22 0849  BP: (!) 148/74  Pulse: 83  Weight: 131 lb (59.4 kg)  Height: 5\' 8"  (1.727 m)   Body mass index is 19.92 kg/m.    06/20/2020    8:00 AM  Montreal Cognitive Assessment   Visuospatial/ Executive (0/5) 5  Naming (0/3) 3  Attention: Read list of digits (0/2) 2  Attention: Read list of letters (0/1) 1  Attention: Serial 7 subtraction starting at 100 (0/3) 3  Language: Repeat phrase (0/2) 2  Language : Fluency (0/1) 1  Abstraction (0/2) 2  Delayed Recall (0/5) 3  Orientation (0/6) 6  Total 28     PHYSICAL EXAMNIATION:  Gen: NAD, conversant, well nourised, well groomed                     Cardiovascular: Regular rate rhythm, no peripheral edema, warm, nontender. Eyes: Conjunctivae clear without exudates or hemorrhage Neck: Supple, no carotid bruits. Pulmonary: Clear to auscultation bilaterally   NEUROLOGICAL EXAM:  MENTAL STATUS: Speech/cognition: Soft voice, decreased vision with correction  CRANIAL NERVES: CN II: Visual fields are full to confrontation.  Pupils are round equal and briskly reactive to light. CN III, IV, VI: extraocular movement are normal. No ptosis. CN V: Facial sensation is intact to pinprick in all 3 divisions bilaterally. Corneal responses are intact.  CN VII: Bilateral temporal wasting, moderate eye closure, cheek puff weakness CN VIII: Hearing is normal to casual conversation CN IX, X: Palate elevates symmetrically. Phonation is normal. CN XI: Head turning and shoulder shrug are intact CN XII: Tongue is midline with normal movements and no atrophy.  MOTOR: Mild bilateral shoulder abduction weakness, right more than left rigidity, bradykinesia, moderate bilateral ankle dorsiflexion weakness,  REFLEXES: Reflexes are hypoactive and symmetric at the biceps, triceps, knees, and ankles. Plantar responses are flexor.  SENSORY: Intact  to light touch, pinprick, positional and vibratory sensation are intact in fingers and toes.  COORDINATION: Rapid alternating movements and fine finger movements are intact. There is no dysmetria on finger-to-nose and heel-knee-shin.    GAIT/STANCE: Need push-up to get up from seated position, decreased right arm swing, leaning forward, en bloc turning, could not stand up on  bilateral heels  ALLERGIES: Allergies  Allergen Reactions   Lisinopril Nausea Only   Other     Poliovax caused fatigue.    HOME MEDICATIONS: Outpatient Medications Prior to Visit  Medication Sig Dispense Refill   amLODipine (NORVASC) 5 MG tablet Take 5 mg by mouth daily.     atorvastatin (LIPITOR) 10 MG tablet Take 10 mg by mouth daily.     carbidopa-levodopa (SINEMET IR) 25-100 MG tablet Take 2 tablets by mouth 3 (three) times daily. 540 tablet 3   irbesartan (AVAPRO) 300 MG tablet Take 300 mg by mouth daily.      Polyvinyl Alcohol-Povidone (REFRESH OP) Apply 1 drop to eye as needed (dry eye).      vitamin B-12 (CYANOCOBALAMIN) 1000 MCG tablet Take 1,000 mcg by mouth daily.     metFORMIN (GLUCOPHAGE-XR) 500 MG 24 hr  tablet Take 500 mg by mouth daily.     No facility-administered medications prior to visit.    PAST MEDICAL HISTORY: Past Medical History:  Diagnosis Date   Abnormal gait    Diabetes (HCC)    Hypercholesteremia    Hypertension    Parkinson disease    Seasonal allergies     PAST SURGICAL HISTORY: Past Surgical History:  Procedure Laterality Date   APPENDECTOMY     CHOLECYSTECTOMY     COLONOSCOPY     normal screening   COLONOSCOPY N/A 05/27/2019   Procedure: COLONOSCOPY;  Surgeon: Corbin Ade, MD;  Location: [facility];  Service: Endoscopy;  Laterality: N/A;  8:15am   HERNIA REPAIR      FAMILY HISTORY: Family History  Problem Relation Age of Onset   Alzheimer's disease Mother    Diabetes Mother    Hypertension Mother    Other Father        potential complications from  hip surgery   Colon cancer Neg Hx     SOCIAL HISTORY: Social History   Socioeconomic History   Marital status: Married    Spouse name: Matthew Graham   Number of children: 2   Years of education: 12   Highest education level: High school graduate  Occupational History   Occupation: Retired  Tobacco Use   Smoking status: Never   Smokeless tobacco: Never  Vaping Use   Vaping Use: Never used  Substance and Sexual Activity   Alcohol use: Not Currently   Drug use: Never   Sexual activity: Not on file  Other Topics Concern   Not on file  Social History Narrative   Lives at home with wife.    Right-handed.   Rare caffeine use.   Social Determinants of Health   Financial Resource Strain: Not on file  Food Insecurity: Not on file  Transportation Needs: Not on file  Physical Activity: Not on file  Stress: Not on file  Social Connections: Not on file  Intimate Partner Violence: Not on file    Levert Feinstein, M.D. Ph.D.  [facility] [address] [place], [place] [address] Phone: [phone] Fax:      [phone]

## 2022-02-27 NOTE — Telephone Encounter (Signed)
Referral for Urology fax to [facility]. Phone: [phone],Fax: [phone] 

## 2022-02-28 LAB — TSH: TSH: 1.09 u[IU]/mL (ref 0.450–4.500)

## 2022-02-28 LAB — COMPREHENSIVE METABOLIC PANEL
ALT: 4 IU/L (ref 0–44)
AST: 14 IU/L (ref 0–40)
Albumin/Globulin Ratio: 1.8 (ref 1.2–2.2)
Albumin: 4.5 g/dL (ref 3.8–4.8)
Alkaline Phosphatase: 67 IU/L (ref 44–121)
BUN/Creatinine Ratio: 27 — ABNORMAL HIGH (ref 10–24)
BUN: 12 mg/dL (ref 8–27)
Bilirubin Total: 0.8 mg/dL (ref 0.0–1.2)
CO2: 27 mmol/L (ref 20–29)
Calcium: 9.7 mg/dL (ref 8.6–10.2)
Chloride: 102 mmol/L (ref 96–106)
Creatinine, Ser: 0.45 mg/dL — ABNORMAL LOW (ref 0.76–1.27)
Globulin, Total: 2.5 g/dL (ref 1.5–4.5)
Glucose: 140 mg/dL — ABNORMAL HIGH (ref 70–99)
Potassium: 4.5 mmol/L (ref 3.5–5.2)
Sodium: 141 mmol/L (ref 134–144)
Total Protein: 7 g/dL (ref 6.0–8.5)
eGFR: 113 mL/min/{1.73_m2} (ref >59)

## 2022-02-28 LAB — C-REACTIVE PROTEIN: CRP: 1 mg/L (ref 0–10)

## 2022-02-28 LAB — SEDIMENTATION RATE: Sed Rate: 2 mm/hr (ref 0–30)

## 2022-02-28 LAB — VITAMIN B12: Vitamin B-12: 870 pg/mL (ref 232–1245)

## 2022-02-28 LAB — CK: Total CK: 56 U/L (ref 41–331)

## 2022-03-29 DIAGNOSIS — I739 Peripheral vascular disease, unspecified: Secondary | ICD-10-CM | POA: Diagnosis not present

## 2022-03-29 DIAGNOSIS — M79672 Pain in left foot: Secondary | ICD-10-CM | POA: Diagnosis not present

## 2022-03-29 DIAGNOSIS — E1140 Type 2 diabetes mellitus with diabetic neuropathy, unspecified: Secondary | ICD-10-CM | POA: Diagnosis not present

## 2022-03-29 DIAGNOSIS — M79671 Pain in right foot: Secondary | ICD-10-CM | POA: Diagnosis not present

## 2022-03-29 DIAGNOSIS — L110 Acquired keratosis follicularis: Secondary | ICD-10-CM | POA: Diagnosis not present

## 2022-04-17 DIAGNOSIS — D692 Other nonthrombocytopenic purpura: Secondary | ICD-10-CM | POA: Diagnosis not present

## 2022-04-17 DIAGNOSIS — E1165 Type 2 diabetes mellitus with hyperglycemia: Secondary | ICD-10-CM | POA: Diagnosis not present

## 2022-04-17 DIAGNOSIS — I10 Essential (primary) hypertension: Secondary | ICD-10-CM | POA: Diagnosis not present

## 2022-04-17 DIAGNOSIS — Z299 Encounter for prophylactic measures, unspecified: Secondary | ICD-10-CM | POA: Diagnosis not present

## 2022-04-17 DIAGNOSIS — R64 Cachexia: Secondary | ICD-10-CM | POA: Diagnosis not present

## 2022-05-23 ENCOUNTER — Ambulatory Visit (INDEPENDENT_AMBULATORY_CARE_PROVIDER_SITE_OTHER): Payer: Medicare Other | Admitting: Neurology

## 2022-05-23 ENCOUNTER — Telehealth: Payer: Self-pay | Admitting: Neurology

## 2022-05-23 VITALS — BP 155/76 | HR 88 | Ht 68.0 in | Wt 131.0 lb

## 2022-05-23 DIAGNOSIS — G6289 Other specified polyneuropathies: Secondary | ICD-10-CM | POA: Diagnosis not present

## 2022-05-23 DIAGNOSIS — R2689 Other abnormalities of gait and mobility: Secondary | ICD-10-CM

## 2022-05-23 DIAGNOSIS — R531 Weakness: Secondary | ICD-10-CM | POA: Diagnosis not present

## 2022-05-23 DIAGNOSIS — G3184 Mild cognitive impairment, so stated: Secondary | ICD-10-CM | POA: Diagnosis not present

## 2022-05-23 DIAGNOSIS — G20A1 Parkinson's disease without dyskinesia, without mention of fluctuations: Secondary | ICD-10-CM | POA: Diagnosis not present

## 2022-05-23 DIAGNOSIS — R269 Unspecified abnormalities of gait and mobility: Secondary | ICD-10-CM | POA: Diagnosis not present

## 2022-05-23 DIAGNOSIS — G629 Polyneuropathy, unspecified: Secondary | ICD-10-CM | POA: Insufficient documentation

## 2022-05-23 NOTE — Procedures (Signed)
Full Name: Matthew Graham Gender: Male MRN #: [id number] Date of Birth: Nov 08, 1950    Visit Date: 05/23/2022 09:40 Age: 72 Years Examining Physician: Dr. Marcial Pacas Referring Physician: Dr. Marcial Pacas Height: 5 feet 8 inch History: 72 year old male with history of Parkinson's disease, complains of gradual onset bilateral lower extremity numbness, weakness,  Summary of the test:  Nerve conduction study: Bilateral sural, superficial peroneal sensory responses were absent.  Bilateral tibial, right peroneal to EDB motor responses were absent.  Left peroneal to EDB motor response showed significantly decreased CMAP amplitude.  Left median sensory response showed moderately prolonged peak latency with mildly decreased snap amplitude.  Left ulnar sensory response showed mildly prolonged distal latency can be related to his cold limb temperature, was normal snap amplitude.  Left median motor responses showed moderately prolonged distal latency, with normal CMAP amplitude.  Left ulnar motor response also showed mildly prolonged distal latency, normal CMAP amplitude, conduction velocity and F-wave latency.  Electromyography: Selected needle examinations were performed at left lower extremity muscle, left lumbosacral paraspinal; left upper extremity and left cervical paraspinal muscles.  There is evidence of chronic neuropathic changes involving left lower extremity muscles, L4-5 S1 myotomes.  There was no spontaneous activity at left lumbosacral paraspinal muscles.  Selected needle examination of left upper extremity muscle and left cervical paraspinal muscles were normal.  Conclusion: This is an normal study.  There is electrodiagnostic evidence of length-dependent severe axonal sensorimotor neuropathy.  In addition, there is evidence of chronic left lumbosacral radiculopathy; moderate left carpal tunnel syndromes.    ------------------------------- Marcial Pacas, M.D. PhD  [facility] [address] [place], [place] [address] Tel: [phone] Fax: [phone]  Verbal informed consent was obtained from the patient, patient was informed of potential risk of procedure, including bruising, bleeding, hematoma formation, infection, muscle weakness, muscle pain, numbness, among others.        [place]    Nerve / Sites Muscle Latency Ref. Amplitude Ref. Rel Amp Segments Distance Velocity Ref. Area    ms ms mV mV %  cm m/s m/s mVms  L Median - APB     Wrist APB 5.0 ?4.4 5.4 ?4.0 100 Wrist - APB 7   19.2     Upper arm APB 11.6  5.1  95.1 Upper arm - Wrist 29 44 ?49 18.5  L Ulnar - ADM     Wrist ADM 4.2 ?3.3 6.1 ?6.0 100 Wrist - ADM 7   21.9     B.Elbow ADM 7.8  6.6  108 B.Elbow - Wrist 18 50 ?49 24.1     A.Elbow ADM 10.8  6.6  100 A.Elbow - B.Elbow 14.5 49 ?49 24.0  L Peroneal - EDB     Ankle EDB 5.3 ?6.5 0.3 ?2.0 100 Ankle - EDB 9   0.6     Fib head EDB 22.5  0.3  100 Fib head - Ankle 21.5 13 ?44 0.6     Pop fossa EDB 17.1  0.2  79.7 Pop fossa - Fib head 13.5 25 ?44 1.0         Pop fossa - Ankle      R Peroneal - EDB     Ankle EDB NR ?6.5 NR ?2.0 NR Ankle - EDB 9   NR         Pop fossa - Ankle      L Tibial - AH     Ankle AH NR ?5.8 NR ?  4.0 NR Ankle - AH 9   NR  R Tibial - AH     Ankle AH NR ?5.8 NR ?4.0 NR Ankle - AH 9   NR                 SNC    Nerve / Sites Rec. Site Peak Lat Ref.  Amp Ref. Segments Distance    ms ms V V  cm  L Sural - Ankle (Calf)     Calf Ankle NR ?4.4 NR ?6 Calf - Ankle 14  R Sural - Ankle (Calf)     Calf Ankle NR ?4.4 NR ?6 Calf - Ankle 14  L Superficial peroneal - Ankle     Lat leg Ankle NR ?4.4 NR ?6 Lat leg - Ankle 14  R Superficial peroneal - Ankle     Lat leg Ankle NR ?4.4 NR ?6 Lat leg - Ankle 14  L Median - Orthodromic (Dig II, Mid palm)     Dig II Wrist 4.3 ?3.4 7 ?10 Dig II - Wrist 13  L Ulnar - Orthodromic, (Dig V, Mid palm)     Dig V Wrist 3.4 ?3.1 8 ?5 Dig V - Wrist 29                 F   Wave    Nerve F Lat Ref.   ms ms  R Ulnar - ADM 35.4 ?32.0       EMG Summary Table    Spontaneous MUAP Recruitment  Muscle IA Fib PSW Fasc Other Amp Dur. Poly Pattern  L. Tibialis anterior Normal None None None _______ Increased Increased 1+ Reduced  L. Tibialis posterior Normal None None None _______ Increased Increased 1+ Reduced  L. Peroneus longus Normal None None None _______ Increased Increased 1+ Reduced  L. Gastrocnemius (Medial head) Normal None None None _______ Increased Increased 1+ Reduced  L. Vastus lateralis Normal None None None _______ Normal Normal Normal Reduced  L. Lumbar paraspinals (low) Normal None None None _______ Normal Normal Normal Normal  L. Lumbar paraspinals (mid) Normal None None None _______ Normal Normal Normal Normal  L. First dorsal interosseous Normal None None None _______ Normal Normal Normal Normal  L. Pronator teres Normal None None None _______ Normal Normal Normal Normal  L. Biceps brachii Normal None None None _______ Normal Normal Normal Normal  L. Deltoid Normal None None None _______ Normal Normal Normal Normal  L. Triceps brachii Normal None None None _______ Normal Normal Normal Normal  L. Extensor digitorum communis Normal None None None _______ Normal Normal Normal Normal  L. Cervical paraspinals Normal None None None _______ Normal Normal Normal Normal

## 2022-05-23 NOTE — Progress Notes (Signed)
ASSESSMENT AND PLAN 72 y.o. year old male   Idiopathic Parkinson's disease  Under relative good control, now taking Sinemet 25/100 mg 1 tablets 4 times a day (6am 10am, 1400, 1600)  Peripheral neuropathy, Probable lumbar radiculopathy  Laboratory evaluations for etiology  MRI of lumbar spine  Urinary urgency, frequency  Was given the prescription of oxybutynin ER 5 mg every night  Worried about the side effect, has not started taking it  DIAGNOSTIC DATA (LABS, IMAGING, TESTING) - I reviewed patient records, labs, notes, testing and imaging myself where available.  HISTORY  Matthew Graham is a 72 year old male, seen in request by his primary care physician [name], Matthew for evaluation of possible Parkinson's disease, he is accompanied by his wife Matthew Graham, initial evaluation was on September 11, 2018.   Past medical history  Hypertension Hyperlipidemia,  diabetes,    He stiill works part-time job, delivery of medicine for [employer]   Around early 2020, he was noted to have gradual onset right hand tremor, change of walking posturing, leaning forward, he also noticed mild rigidity of right leg, difficulty making rapid rhythmic movements with his right foot   He does have couple years history of gradually decreased sense of smell, he denies REM sleep disorder, when he bends down and gets up quickly, he sometime has lightheadedness sensation.   [facility] at [facility] was normal in Feb 2020. laboratory evaluations was done in February   UPDATE Dec 18 2018: He is tolerating Requip 1 mg instead of 3 times a day, he only take 1 mg twice a day, he denies significant side effect, he still work part-time job driving cars delivering medications, he does notice some improvement, he can move better   We personally reviewed MRI of the brain, mild supratentorium small vessel disease, there was no acute abnormality   UPDATE April 11th 2022: He often only took Sinemet 25/100  + Requip '2mg'$  twice a day instead of three times a day, he still works part time, driving and delivering medications.   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, which I witnessed 1 episode today, during MoCA examination, he bgan laughing and then quickly becomes tearful, patient reported that when he was challenged, he felt frustrated, the response is often out of proportion to how he feels, he denies significant depression,   He complains of mild memory loss, denies difficulty handling daily activity, MoCA examination 28/30 today   UPDATE Mar 30 2021: He is accompanied by his wife at today's visit, slow worsening over time, increased constipation, orthostatic dizziness, drooling, frequent awakening at nighttime,  He still delivers medicine for local pharmacy, drive, also wall climb up stairs denies difficulties  UPDATE Feb 27 2022: He is accompanied by his wife at today's visit, complains of weak cough, difficulty clearing his throat, thick secretions to the point of difficulty eating sometimes, reported fair appetite, but has trouble keeping a regular sleep pattern, tends to use falling to sleep watching TV in his recliner, then get up using bathroom, by the time that he went upstairs, he  was wide-awake, tosses and turns in the bed, difficulty falling to sleep again,  Also complains of frequent awakening at nighttime, using bathroom, small-volume,   UPDATE May 23 2022: Laboratory evaluation in December 2023 showed normal or negative B12, C-reactive protein, TSH, ESR, CPK, CMP He return for electrodiagnostic study today, which showed evidence of severe axonal  sensorimotor polyneuropathy, also evidence of chronic left lumbosacral radiculopathy, moderate left carpal tunnel syndromes.  He complains of bilateral foot numbness, toe weakness, difficulty clear floor, no longer working, taking Sinemet  25/100 mg starting at 6 AM, every 4 hours, 4 tablets in the day, does help his movement,  Was given the prescription of oxybutynin for urinary frequency, frequent awakening at nighttime, worry about the side effect, decided not to proceed   REVIEW OF SYSTEMS: Out of a complete 14 system review of symptoms, the patient complains only of the following symptoms, and all other reviewed systems are negative.  See HPI  PHYSICAL EXAM  Vitals:   02/27/22 0849  BP: (!) 148/74  Pulse: 83  Weight: 131 lb (59.4 kg)  Height: '5\' 8"'$  (1.727 m)   Body mass index is 19.92 kg/m.    06/20/2020    8:00 AM  Montreal Cognitive Assessment   Visuospatial/ Executive (0/5) 5  Naming (0/3) 3  Attention: Read list of digits (0/2) 2  Attention: Read list of letters (0/1) 1  Attention: Serial 7 subtraction starting at 100 (0/3) 3  Language: Repeat phrase (0/2) 2  Language : Fluency (0/1) 1  Abstraction (0/2) 2  Delayed Recall (0/5) 3  Orientation (0/6) 6  Total 28     PHYSICAL EXAMNIATION:  Gen: NAD, conversant, well nourised, well groomed                     Cardiovascular: Regular rate rhythm, no peripheral edema, warm, nontender. Eyes: Conjunctivae clear without exudates or hemorrhage Neck: Supple, no carotid bruits. Pulmonary: Clear to auscultation bilaterally   NEUROLOGICAL EXAM:  MENTAL STATUS: Speech/cognition: Soft voice, decreased facial expression  CRANIAL NERVES: CN II: Visual fields are full to confrontation.  Pupils are round equal and briskly reactive to light. CN III, IV, VI: extraocular movement are normal. No ptosis. CN V: Facial sensation is intact to pinprick in all 3 divisions bilaterally. Corneal responses are intact.  CN VII: Bilateral  temporal wasting, moderate eye closure, cheek puff weakness CN VIII: Hearing is normal to casual conversation CN IX, X: Palate elevates symmetrically. Phonation is normal. CN XI: Head turning and shoulder shrug are intact CN XII: Tongue is midline with normal movements and no atrophy.  MOTOR: Right more than left rigidity, bradykinesia, moderate bilateral toe extension, flexion weakness, no significant finger flexion weakness, bilateral temporal wasting,  REFLEXES: Reflexes are hypoactive and symmetric at the biceps, triceps, knees, and ankles. Plantar responses are flexor.  SENSORY: Mildly length-dependent decreased light touch, pinprick to ankle level  COORDINATION: Rapid alternating movements and fine finger movements are intact. There is no dysmetria on finger-to-nose and heel-knee-shin.    GAIT/STANCE: Need push-up to get up from seated position, decreased right arm swing, leaning forward, en bloc turning, could not stand up on  bilateral heels and tiptoe  ALLERGIES: Allergies  Allergen Reactions   Lisinopril Nausea Only   Other     Poliovax caused fatigue.    HOME MEDICATIONS: Outpatient Medications Prior to Visit  Medication Sig Dispense Refill   amLODipine (NORVASC) 5 MG tablet Take 5 mg by mouth daily.  atorvastatin (LIPITOR) 10 MG tablet Take 10 mg by mouth daily.     carbidopa-levodopa (SINEMET IR) 25-100 MG tablet Take 2 tablets by mouth 3 (three) times daily. 540 tablet 3   irbesartan (AVAPRO) 300 MG tablet Take 300 mg by mouth daily.      oxybutynin (DITROPAN-XL) 5 MG 24 hr tablet Take 1 tablet (5 mg total) by mouth at bedtime. 30 tablet 6   Polyvinyl Alcohol-Povidone (REFRESH OP) Apply 1 drop to eye as needed (dry eye).      vitamin B-12 (CYANOCOBALAMIN) 1000 MCG tablet Take 1,000 mcg by mouth daily.     No facility-administered medications prior to visit.    PAST MEDICAL HISTORY: Past Medical History:  Diagnosis Date   Abnormal gait    Diabetes ([place])     Hypercholesteremia    Hypertension    Parkinson disease    Seasonal allergies     PAST SURGICAL HISTORY: Past Surgical History:  Procedure Laterality Date   APPENDECTOMY     CHOLECYSTECTOMY     COLONOSCOPY     normal screening   COLONOSCOPY N/A 05/27/2019   Procedure: COLONOSCOPY;  Surgeon: Daneil Dolin, MD;  Location: [facility];  Service: Endoscopy;  Laterality: N/A;  8:15am   HERNIA REPAIR      FAMILY HISTORY: Family History  Problem Relation Age of Onset   Alzheimer's disease Mother    Diabetes Mother    Hypertension Mother    Other Father        potential complications from hip surgery   Colon cancer Neg Hx     SOCIAL HISTORY: Social History   Socioeconomic History   Marital status: Married    Spouse name: Matthew Graham   Number of children: 2   Years of education: 12   Highest education level: High school graduate  Occupational History   Occupation: Retired  Tobacco Use   Smoking status: Never   Smokeless tobacco: Never  Vaping Use   Vaping Use: Never used  Substance and Sexual Activity   Alcohol use: Not Currently   Drug use: Never   Sexual activity: Not on file  Other Topics Concern   Not on file  Social History Narrative   Lives at home with wife.    Right-handed.   Rare caffeine use.   Social Determinants of Health   Financial Resource Strain: Not on file  Food Insecurity: Not on file  Transportation Needs: Not on file  Physical Activity: Not on file  Stress: Not on file  Social Connections: Not on file  Intimate Partner Violence: Not on file    Marcial Pacas, M.D. Ph.D.  [facility] [place], [place] [address] Phone: [phone] Fax:      [phone]

## 2022-05-23 NOTE — Telephone Encounter (Signed)
medicare NPR sent to [facility] [phone] 

## 2022-05-29 LAB — MULTIPLE MYELOMA PANEL, SERUM
Albumin SerPl Elph-Mcnc: 4.2 g/dL (ref 2.9–4.4)
Albumin/Glob SerPl: 1.2 (ref 0.7–1.7)
Alpha 1: 0.3 g/dL (ref 0.0–0.4)
Alpha2 Glob SerPl Elph-Mcnc: 0.9 g/dL (ref 0.4–1.0)
B-Globulin SerPl Elph-Mcnc: 1.1 g/dL (ref 0.7–1.3)
Gamma Glob SerPl Elph-Mcnc: 1.4 g/dL (ref 0.4–1.8)
Globulin, Total: 3.6 g/dL (ref 2.2–3.9)
IgA/Immunoglobulin A, Serum: 286 mg/dL (ref 61–437)
IgG (Immunoglobin G), Serum: 1076 mg/dL (ref 603–1613)
IgM (Immunoglobulin M), Srm: 95 mg/dL (ref 15–143)
Total Protein: 7.8 g/dL (ref 6.0–8.5)

## 2022-05-29 LAB — CBC WITH DIFFERENTIAL
Basophils Absolute: 0 10*3/uL (ref 0.0–0.2)
Basos: 1 %
EOS (ABSOLUTE): 0 10*3/uL (ref 0.0–0.4)
Eos: 0 %
Hematocrit: 45.5 % (ref 37.5–51.0)
Hemoglobin: 15.2 g/dL (ref 13.0–17.7)
Immature Grans (Abs): 0 10*3/uL (ref 0.0–0.1)
Immature Granulocytes: 0 %
Lymphocytes Absolute: 1.1 10*3/uL (ref 0.7–3.1)
Lymphs: 16 %
MCH: 29.9 pg (ref 26.6–33.0)
MCHC: 33.4 g/dL (ref 31.5–35.7)
MCV: 89 fL (ref 79–97)
Monocytes Absolute: 0.3 10*3/uL (ref 0.1–0.9)
Monocytes: 5 %
Neutrophils Absolute: 5 10*3/uL (ref 1.4–7.0)
Neutrophils: 78 %
RBC: 5.09 x10E6/uL (ref 4.14–5.80)
RDW: 12.6 % (ref 11.6–15.4)
WBC: 6.4 10*3/uL (ref 3.4–10.8)

## 2022-05-29 LAB — ANA W/REFLEX IF POSITIVE: Anti Nuclear Antibody (ANA): NEGATIVE

## 2022-05-29 LAB — HGB A1C W/O EAG: Hgb A1c MFr Bld: 6.3 % — ABNORMAL HIGH (ref 4.8–5.6)

## 2022-06-11 ENCOUNTER — Ambulatory Visit (HOSPITAL_COMMUNITY)
Admission: RE | Admit: 2022-06-11 | Discharge: 2022-06-11 | Disposition: A | Discharge status: Home / Self Care | Payer: Medicare Other | Source: Ambulatory Visit | Attending: Neurology | Admitting: Neurology

## 2022-06-11 DIAGNOSIS — G6289 Other specified polyneuropathies: Secondary | ICD-10-CM | POA: Diagnosis not present

## 2022-06-11 DIAGNOSIS — R531 Weakness: Secondary | ICD-10-CM | POA: Insufficient documentation

## 2022-06-11 DIAGNOSIS — G20A1 Parkinson's disease without dyskinesia, without mention of fluctuations: Secondary | ICD-10-CM | POA: Diagnosis not present

## 2022-06-11 DIAGNOSIS — R269 Unspecified abnormalities of gait and mobility: Secondary | ICD-10-CM | POA: Insufficient documentation

## 2022-06-11 DIAGNOSIS — M545 Low back pain, unspecified: Secondary | ICD-10-CM | POA: Diagnosis not present

## 2022-06-11 DIAGNOSIS — G3184 Mild cognitive impairment, so stated: Secondary | ICD-10-CM | POA: Diagnosis not present

## 2022-06-12 DIAGNOSIS — M79671 Pain in right foot: Secondary | ICD-10-CM | POA: Diagnosis not present

## 2022-06-12 DIAGNOSIS — L110 Acquired keratosis follicularis: Secondary | ICD-10-CM | POA: Diagnosis not present

## 2022-06-12 DIAGNOSIS — E1140 Type 2 diabetes mellitus with diabetic neuropathy, unspecified: Secondary | ICD-10-CM | POA: Diagnosis not present

## 2022-06-12 DIAGNOSIS — M79672 Pain in left foot: Secondary | ICD-10-CM | POA: Diagnosis not present

## 2022-06-12 DIAGNOSIS — I739 Peripheral vascular disease, unspecified: Secondary | ICD-10-CM | POA: Diagnosis not present

## 2022-08-01 DIAGNOSIS — Z299 Encounter for prophylactic measures, unspecified: Secondary | ICD-10-CM | POA: Diagnosis not present

## 2022-08-01 DIAGNOSIS — I10 Essential (primary) hypertension: Secondary | ICD-10-CM | POA: Diagnosis not present

## 2022-08-01 DIAGNOSIS — G20A1 Parkinson's disease without dyskinesia, without mention of fluctuations: Secondary | ICD-10-CM | POA: Diagnosis not present

## 2022-08-01 DIAGNOSIS — E1159 Type 2 diabetes mellitus with other circulatory complications: Secondary | ICD-10-CM | POA: Diagnosis not present

## 2022-08-01 DIAGNOSIS — R64 Cachexia: Secondary | ICD-10-CM | POA: Diagnosis not present

## 2022-08-01 DIAGNOSIS — I152 Hypertension secondary to endocrine disorders: Secondary | ICD-10-CM | POA: Diagnosis not present

## 2022-08-28 DIAGNOSIS — E1140 Type 2 diabetes mellitus with diabetic neuropathy, unspecified: Secondary | ICD-10-CM | POA: Diagnosis not present

## 2022-08-28 DIAGNOSIS — L110 Acquired keratosis follicularis: Secondary | ICD-10-CM | POA: Diagnosis not present

## 2022-08-28 DIAGNOSIS — M79671 Pain in right foot: Secondary | ICD-10-CM | POA: Diagnosis not present

## 2022-08-28 DIAGNOSIS — I739 Peripheral vascular disease, unspecified: Secondary | ICD-10-CM | POA: Diagnosis not present

## 2022-08-28 DIAGNOSIS — M79672 Pain in left foot: Secondary | ICD-10-CM | POA: Diagnosis not present

## 2022-09-17 ENCOUNTER — Ambulatory Visit (INDEPENDENT_AMBULATORY_CARE_PROVIDER_SITE_OTHER): Payer: Medicare Other | Admitting: Neurology

## 2022-09-17 ENCOUNTER — Encounter: Payer: Self-pay | Admitting: Neurology

## 2022-09-17 VITALS — BP 147/71 | HR 82 | Ht 68.0 in | Wt 125.4 lb

## 2022-09-17 DIAGNOSIS — K5900 Constipation, unspecified: Secondary | ICD-10-CM | POA: Diagnosis not present

## 2022-09-17 DIAGNOSIS — G20A1 Parkinson's disease without dyskinesia, without mention of fluctuations: Secondary | ICD-10-CM | POA: Diagnosis not present

## 2022-09-17 MED ORDER — CARBIDOPA-LEVODOPA 25-100 MG PO TABS: 1.0000 | ORAL_TABLET | Freq: Four times a day (QID) | ORAL | 3 refills | Status: DC

## 2022-09-17 NOTE — Progress Notes (Signed)
ASSESSMENT AND PLAN 72 y.o. year old male   Idiopathic Parkinson's disease  Under relative good control, now taking Sinemet 25/100 mg 1 tablets 4 times a day (6am 10am, 1400, 1800)  Chronic constipation:  Improved with Miralex as needed.  DIAGNOSTIC DATA (LABS, IMAGING, TESTING) - I reviewed patient records, labs, notes, testing and imaging myself where available.  HISTORY  Matthew Graham is a 72 year old male, seen in request by his primary care physician [name], [name] for evaluation of possible Parkinson's disease, he is accompanied by his wife [name], initial evaluation was on September 11, 2018.   Past medical history  Hypertension Hyperlipidemia, Diabetes,    He stiill works part-time job, delivery of medicine for [employer]   Around early 2020, he was noted to have gradual onset right hand tremor, change of walking posturing, leaning forward, he also noticed mild rigidity of right leg, difficulty making rapid rhythmic movements with his right foot   He does have couple years history of gradually decreased sense of smell, he denies REM sleep disorder, when he bends down and gets up quickly, he sometime has lightheadedness sensation.   MRI of brain at [facility] was normal in Feb 2020. laboratory evaluations was done in February   UPDATE Dec 18 2018: He is tolerating Requip 1 mg instead of 3 times a day, he only take 1 mg twice a day, he denies significant side effect, he still work part-time job driving cars delivering medications, he does notice some improvement, he can move better   We personally reviewed MRI of the brain, mild supratentorium small vessel disease, there was no acute abnormality   UPDATE April 11th 2022: He often only took Sinemet 25/100 + Requip 2mg  twice a day instead of three times a day, he still works part time, driving and delivering medications.   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, which I witnessed 1 episode today, during MoCA examination, he bgan laughing and then quickly becomes tearful, patient reported that when he was challenged, he felt frustrated, the response is often out of proportion to how he feels, he denies significant depression,   He complains of mild memory loss, denies difficulty handling daily activity, MoCA examination 28/30 today   UPDATE Mar 30 2021: He is accompanied by his wife at today's visit, slow worsening over time, increased constipation, orthostatic dizziness, drooling, frequent awakening at nighttime,  He still delivers medicine for local pharmacy, drive, also wall climb up stairs denies difficulties  UPDATE Feb 27 2022: He is accompanied by his wife at today's visit, complains of weak cough, difficulty clearing his throat, thick secretions to the point of difficulty eating sometimes, reported fair appetite, but has trouble keeping a regular sleep pattern, tends to use falling to sleep watching TV in his recliner, then get up using bathroom, by the time that he went upstairs, he was wide-awake, tosses and turns in the bed, difficulty falling to sleep again,  Also complains of frequent awakening at nighttime, using bathroom, small-volume,   UPDATE May 23 2022: Laboratory evaluation in December 2023  showed normal or negative B12, C-reactive protein, TSH, ESR, CPK, CMP He return for electrodiagnostic study today, which showed evidence of severe axonal sensorimotor polyneuropathy, also evidence of chronic left lumbosacral radiculopathy, moderate left carpal tunnel syndromes.  He complains of bilateral foot numbness, toe weakness, difficulty clear floor,  no longer working, taking Sinemet  25/100 mg starting at 6 AM, every 4 hours, 4 tablets in the day, does help his movement,  Was given the prescription of oxybutynin for urinary frequency, frequent awakening at nighttime, worry about the side effect, decided not to proceed  UPDATE July 8th 2024: He is with his wife, PD is under good control taking sinemet 25/100 x4 times a day, ambulate with out much difficulty, sleeps well, dealing with chronic constipation, good appetite.   Personally reviewed MRI of lumbar spine, mild degenerative changes, no significant canal or foraminal narrowing  REVIEW OF SYSTEMS: Out of a complete 14 system review of symptoms, the patient complains only of the following symptoms, and all other reviewed systems are negative.  See HPI  PHYSICAL EXAM  Vitals:   02/27/22 0849  BP: (!) 148/74  Pulse: 83  Weight: 131 lb (59.4 kg)  Height: 5\' 8"  (1.727 m)   Body mass index is 19.92 kg/m.    06/20/2020    8:00 AM  Montreal Cognitive Assessment   Visuospatial/ Executive (0/5) 5  Naming (0/3) 3  Attention: Read list of digits (0/2) 2  Attention: Read list of letters (0/1) 1  Attention: Serial 7 subtraction starting at 100 (0/3) 3  Language: Repeat phrase (0/2) 2  Language : Fluency (0/1) 1  Abstraction (0/2) 2  Delayed Recall (0/5) 3  Orientation (0/6) 6  Total 28     PHYSICAL EXAMNIATION:  Gen: NAD, conversant, well nourised, well groomed                     Cardiovascular: Regular rate rhythm, no peripheral edema, warm, nontender. Eyes: Conjunctivae clear without exudates or hemorrhage Neck: Supple, no carotid bruits. Pulmonary: Clear to auscultation bilaterally   NEUROLOGICAL EXAM:  MENTAL STATUS: Speech/cognition: Soft voice, decreased facial expression  CRANIAL NERVES: CN II: Visual fields are full to confrontation.  Pupils are round equal and briskly reactive to light. CN III, IV, VI: extraocular movement are normal. No ptosis. CN V:  Facial sensation is intact to pinprick in all 3 divisions bilaterally. Corneal responses are intact.  CN VII: Bilateral temporal wasting, moderate eye closure, cheek puff weakness CN VIII: Hearing is normal to casual conversation CN IX, X: Palate elevates symmetrically. Phonation is normal. CN XI: Head turning and shoulder shrug are intact CN XII: Tongue is midline with normal movements and no atrophy.  MOTOR: Right more than left mild rigidity, bradykinesia,    REFLEXES: Reflexes are hypoactive and symmetric at the biceps, triceps, knees, and ankles. Plantar responses are flexor.  SENSORY: Mildly length-dependent decreased light touch, pinprick to ankle level  COORDINATION: Rapid alternating movements and fine finger movements are intact. There is no dysmetria on finger-to-nose and heel-knee-shin.    GAIT/STANCE: Need push-up to get up from seated position, decreased right arm swing, leaning forward, en bloc turning,    ALLERGIES: Allergies  Allergen Reactions   Lisinopril Nausea Only   Other     Poliovax caused fatigue.    HOME MEDICATIONS: Outpatient Medications Prior to Visit  Medication Sig Dispense Refill   amLODipine (NORVASC) 5 MG tablet Take 5 mg by mouth daily.     atorvastatin (  LIPITOR) 10 MG tablet Take 10 mg by mouth daily.     carbidopa-levodopa (SINEMET IR) 25-100 MG tablet Take 1 tablet by mouth 4 (four) times daily.     irbesartan (AVAPRO) 300 MG tablet Take 300 mg by mouth daily.      Polyvinyl Alcohol-Povidone (REFRESH OP) Apply 1 drop to eye as needed (dry eye).      vitamin B-12 (CYANOCOBALAMIN) 1000 MCG tablet Take 1,000 mcg by mouth daily.     carbidopa-levodopa (SINEMET IR) 25-100 MG tablet Take 2 tablets by mouth 3 (three) times daily. (Patient taking differently: Take 1 tablet by mouth 4 (four) times daily.) 540 tablet 3   oxybutynin (DITROPAN-XL) 5 MG 24 hr tablet Take 1 tablet (5 mg total) by mouth at bedtime. 30 tablet 6   No facility-administered  medications prior to visit.    PAST MEDICAL HISTORY: Past Medical History:  Diagnosis Date   Abnormal gait    Diabetes (HCC)    Hypercholesteremia    Hypertension    Parkinson disease    Seasonal allergies     PAST SURGICAL HISTORY: Past Surgical History:  Procedure Laterality Date   APPENDECTOMY     CHOLECYSTECTOMY     COLONOSCOPY     normal screening   COLONOSCOPY N/A 05/27/2019   Procedure: COLONOSCOPY;  Surgeon: Corbin Ade, MD;  Location: [facility];  Service: Endoscopy;  Laterality: N/A;  8:15am   HERNIA REPAIR      FAMILY HISTORY: Family History  Problem Relation Age of Onset   Alzheimer's disease Mother    Diabetes Mother    Hypertension Mother    Other Father        potential complications from hip surgery   Colon cancer Neg Hx     SOCIAL HISTORY: Social History   Socioeconomic History   Marital status: Married    Spouse name: [name]   Number of children: 2   Years of education: 12   Highest education level: High school graduate  Occupational History   Occupation: Retired  Tobacco Use   Smoking status: Never   Smokeless tobacco: Never  Vaping Use   Vaping Use: Never used  Substance and Sexual Activity   Alcohol use: Not Currently   Drug use: Never   Sexual activity: Yes    Birth control/protection: None  Other Topics Concern   Not on file  Social History Narrative   Lives at home with wife.    Right-handed.   Rare caffeine use.   Social Determinants of Health   Financial Resource Strain: Not on file  Food Insecurity: Not on file  Transportation Needs: Not on file  Physical Activity: Not on file  Stress: Not on file  Social Connections: Not on file  Intimate Partner Violence: Not on file    Levert Feinstein, M.D. Ph.D.  [facility] [address] [place], [place] [address] Phone: [phone] Fax:      [phone]

## 2022-11-06 DIAGNOSIS — M79672 Pain in left foot: Secondary | ICD-10-CM | POA: Diagnosis not present

## 2022-11-06 DIAGNOSIS — I739 Peripheral vascular disease, unspecified: Secondary | ICD-10-CM | POA: Diagnosis not present

## 2022-11-06 DIAGNOSIS — E1140 Type 2 diabetes mellitus with diabetic neuropathy, unspecified: Secondary | ICD-10-CM | POA: Diagnosis not present

## 2022-11-06 DIAGNOSIS — M79671 Pain in right foot: Secondary | ICD-10-CM | POA: Diagnosis not present

## 2022-11-06 DIAGNOSIS — L110 Acquired keratosis follicularis: Secondary | ICD-10-CM | POA: Diagnosis not present

## 2022-11-09 DIAGNOSIS — Z299 Encounter for prophylactic measures, unspecified: Secondary | ICD-10-CM | POA: Diagnosis not present

## 2022-11-09 DIAGNOSIS — Z1339 Encounter for screening examination for other mental health and behavioral disorders: Secondary | ICD-10-CM | POA: Diagnosis not present

## 2022-11-09 DIAGNOSIS — R5383 Other fatigue: Secondary | ICD-10-CM | POA: Diagnosis not present

## 2022-11-09 DIAGNOSIS — E7800 Pure hypercholesterolemia, unspecified: Secondary | ICD-10-CM | POA: Diagnosis not present

## 2022-11-09 DIAGNOSIS — I10 Essential (primary) hypertension: Secondary | ICD-10-CM | POA: Diagnosis not present

## 2022-11-09 DIAGNOSIS — Z0000 Encounter for general adult medical examination without abnormal findings: Secondary | ICD-10-CM | POA: Diagnosis not present

## 2022-11-09 DIAGNOSIS — Z79899 Other long term (current) drug therapy: Secondary | ICD-10-CM | POA: Diagnosis not present

## 2022-11-09 DIAGNOSIS — Z125 Encounter for screening for malignant neoplasm of prostate: Secondary | ICD-10-CM | POA: Diagnosis not present

## 2022-11-09 DIAGNOSIS — Z7189 Other specified counseling: Secondary | ICD-10-CM | POA: Diagnosis not present

## 2022-11-09 DIAGNOSIS — Z1331 Encounter for screening for depression: Secondary | ICD-10-CM | POA: Diagnosis not present

## 2022-11-22 ENCOUNTER — Ambulatory Visit: Payer: Medicare Other | Admitting: Neurology

## 2022-12-08 ENCOUNTER — Other Ambulatory Visit: Payer: Self-pay | Admitting: Neurology

## 2022-12-27 DIAGNOSIS — Z23 Encounter for immunization: Secondary | ICD-10-CM | POA: Diagnosis not present

## 2023-01-22 DIAGNOSIS — I739 Peripheral vascular disease, unspecified: Secondary | ICD-10-CM | POA: Diagnosis not present

## 2023-01-22 DIAGNOSIS — L110 Acquired keratosis follicularis: Secondary | ICD-10-CM | POA: Diagnosis not present

## 2023-01-22 DIAGNOSIS — M79671 Pain in right foot: Secondary | ICD-10-CM | POA: Diagnosis not present

## 2023-01-22 DIAGNOSIS — M79672 Pain in left foot: Secondary | ICD-10-CM | POA: Diagnosis not present

## 2023-01-22 DIAGNOSIS — E1140 Type 2 diabetes mellitus with diabetic neuropathy, unspecified: Secondary | ICD-10-CM | POA: Diagnosis not present

## 2023-04-02 DIAGNOSIS — M79672 Pain in left foot: Secondary | ICD-10-CM | POA: Diagnosis not present

## 2023-04-02 DIAGNOSIS — L110 Acquired keratosis follicularis: Secondary | ICD-10-CM | POA: Diagnosis not present

## 2023-04-02 DIAGNOSIS — E1140 Type 2 diabetes mellitus with diabetic neuropathy, unspecified: Secondary | ICD-10-CM | POA: Diagnosis not present

## 2023-04-02 DIAGNOSIS — I739 Peripheral vascular disease, unspecified: Secondary | ICD-10-CM | POA: Diagnosis not present

## 2023-04-02 DIAGNOSIS — M79671 Pain in right foot: Secondary | ICD-10-CM | POA: Diagnosis not present

## 2023-05-07 DIAGNOSIS — I152 Hypertension secondary to endocrine disorders: Secondary | ICD-10-CM | POA: Diagnosis not present

## 2023-05-07 DIAGNOSIS — G20A1 Parkinson's disease without dyskinesia, without mention of fluctuations: Secondary | ICD-10-CM | POA: Diagnosis not present

## 2023-05-07 DIAGNOSIS — E1159 Type 2 diabetes mellitus with other circulatory complications: Secondary | ICD-10-CM | POA: Diagnosis not present

## 2023-05-07 DIAGNOSIS — I10 Essential (primary) hypertension: Secondary | ICD-10-CM | POA: Diagnosis not present

## 2023-05-07 DIAGNOSIS — Z299 Encounter for prophylactic measures, unspecified: Secondary | ICD-10-CM | POA: Diagnosis not present

## 2023-05-07 DIAGNOSIS — R64 Cachexia: Secondary | ICD-10-CM | POA: Diagnosis not present

## 2023-05-16 DIAGNOSIS — L820 Inflamed seborrheic keratosis: Secondary | ICD-10-CM | POA: Diagnosis not present

## 2023-05-16 DIAGNOSIS — I10 Essential (primary) hypertension: Secondary | ICD-10-CM | POA: Diagnosis not present

## 2023-05-16 DIAGNOSIS — Z299 Encounter for prophylactic measures, unspecified: Secondary | ICD-10-CM | POA: Diagnosis not present

## 2023-05-16 DIAGNOSIS — D485 Neoplasm of uncertain behavior of skin: Secondary | ICD-10-CM | POA: Diagnosis not present

## 2023-05-16 DIAGNOSIS — L821 Other seborrheic keratosis: Secondary | ICD-10-CM | POA: Diagnosis not present

## 2023-06-11 DIAGNOSIS — M79672 Pain in left foot: Secondary | ICD-10-CM | POA: Diagnosis not present

## 2023-06-11 DIAGNOSIS — L110 Acquired keratosis follicularis: Secondary | ICD-10-CM | POA: Diagnosis not present

## 2023-06-11 DIAGNOSIS — M79671 Pain in right foot: Secondary | ICD-10-CM | POA: Diagnosis not present

## 2023-06-11 DIAGNOSIS — I739 Peripheral vascular disease, unspecified: Secondary | ICD-10-CM | POA: Diagnosis not present

## 2023-06-11 DIAGNOSIS — E1140 Type 2 diabetes mellitus with diabetic neuropathy, unspecified: Secondary | ICD-10-CM | POA: Diagnosis not present

## 2023-06-17 NOTE — Progress Notes (Unsigned)
 ASSESSMENT AND PLAN 73 y.o. year old male   Idiopathic Parkinson's disease Dysphagia  Sialorrhea  -Try taking Sinemet 1 tablet 4 times daily 30-60 minutes BEFORE meals to see if benefit to dysphagia -Referral to Speech Therapy, has been evaluated in the past with [facility] Swallow evaluation 2021 showing moderate pharyngeal phase dysphagia, recommended ENT consult (suspected decreased vocal fold closure-he never saw ENT) - Continue exercise, activity - Follow-up in 6 months with [name]   DIAGNOSTIC DATA (LABS, IMAGING, TESTING) - I reviewed patient records, labs, notes, testing and imaging myself where available.  HISTORY  Matthew Graham is a 73 year old male, seen in request by his primary care physician [name], [name] for evaluation of possible Parkinson's disease, he is accompanied by his wife [name], initial evaluation was on September 11, 2018.   Past medical history  Hypertension Hyperlipidemia, Diabetes,    He stiill works part-time job, delivery of medicine for [employer]   Around early 2020, he was noted to have gradual onset right hand tremor, change of walking posturing, leaning forward, he also noticed mild rigidity of right leg, difficulty making rapid rhythmic movements with his right foot   He does have couple years history of gradually decreased sense of smell, he denies REM sleep disorder, when he bends down and gets up quickly, he sometime has lightheadedness sensation.   MRI [facility] at [facility] was normal in Feb 2020. laboratory evaluations was done in February   UPDATE Dec 18 2018: He is tolerating Requip 1 mg instead of 3 times a day, he only take 1 mg twice a day, he denies significant side effect, he still work part-time job driving cars delivering medications, he does notice some improvement, he can move better   We personally reviewed MRI of the brain, mild supratentorium small vessel disease, there was no acute abnormality   UPDATE April 11th  2022: He often only took Sinemet 25/100 + Requip 2mg  twice a day instead of three times a day, he still works part time, driving and delivering medications.   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, which I witnessed 1 episode today, during MoCA examination, he bgan laughing and then quickly becomes tearful, patient reported that when he was challenged, he felt frustrated, the response is often out of proportion to how he feels, he denies significant depression,   He complains of mild memory loss, denies difficulty handling daily activity, MoCA examination 28/30 today   UPDATE Mar 30 2021: He is accompanied by his wife at today's visit, slow worsening over time, increased constipation, orthostatic dizziness, drooling, frequent awakening at nighttime,  He still delivers medicine for local pharmacy, drive, also wall climb up stairs denies difficulties  UPDATE Feb 27 2022: He is accompanied by his wife at today's visit, complains of weak cough, difficulty clearing his throat, thick secretions to the point of difficulty eating sometimes, reported fair appetite, but has trouble keeping a regular sleep pattern, tends to use falling to sleep watching TV in his recliner, then get up using bathroom, by the time that he went upstairs, he was  wide-awake, tosses and turns in the bed, difficulty falling to sleep again,  Also complains of frequent awakening at nighttime, using bathroom, small-volume,   UPDATE May 23 2022: Laboratory evaluation in December 2023 showed normal or negative B12, C-reactive protein, TSH, ESR, CPK, CMP He return for electrodiagnostic study today,  which showed evidence of severe axonal sensorimotor polyneuropathy, also evidence of chronic left lumbosacral radiculopathy, moderate left carpal tunnel syndromes.  He complains of bilateral foot numbness, toe weakness, difficulty clear floor, no longer working, taking Sinemet  25/100 mg starting at 6 AM, every 4 hours, 4 tablets in the day, does help his movement,  Was given the prescription of oxybutynin for urinary frequency, frequent awakening at nighttime, worry about the side effect, decided not to proceed  UPDATE July 8th 2024: He is with his wife, PD is under good control taking sinemet 25/100 x4 times a day, ambulate with out much difficulty, sleeps well, dealing with chronic constipation, good appetite.   Personally reviewed MRI of lumbar spine, mild degenerative changes, no significant canal or foraminal narrowing  Update 06/18/23 SS: Remains on Sinemet 25/100 QID (6, 11, 3, 7). No freezing or gait change. Occasional left leg tremor. No falls. He does drool. He has lost several teeth, has to really chew. Planning to get back to biking. Continues with dysphagia, coughs during eating, avoids eating out of home, sits alone for meals due to fear off projecting food. He didn't take his Sinemet yet. Weight is about the same as last year. Constipation is better with Miralax.   REVIEW OF SYSTEMS: Out of a complete 14 system review of symptoms, the patient complains only of the following symptoms, and all other reviewed systems are negative.  See HPI  PHYSICAL EXAM  Vitals:   02/27/22 0849  BP: (!) 148/74  Pulse: 83  Weight: 131 lb (59.4 kg)  Height: 5\' 8"  (1.727 m)   Body mass index is 19.92 kg/m.    06/20/2020    8:00 AM  Montreal Cognitive Assessment   Visuospatial/ Executive (0/5) 5  Naming (0/3) 3  Attention: Read list of digits (0/2) 2  Attention: Read list of letters (0/1) 1  Attention: Serial 7 subtraction starting at 100 (0/3) 3  Language: Repeat phrase (0/2) 2  Language  : Fluency (0/1) 1  Abstraction (0/2) 2  Delayed Recall (0/5) 3  Orientation (0/6) 6  Total 28    Physical Exam  General: The patient is alert and cooperative at the time of the examination.  Skin: No significant peripheral edema is noted.  Neurologic Exam  Mental status: The patient is alert and oriented x 3 at the time of the examination. The patient has apparent normal recent and remote memory, with an apparently normal attention span and concentration ability.  Cranial nerves: Soft voice, masking of the face, swallows often, no drooling was seen  Motor: The patient has good strength in all 4 extremities. Bradykinesia mild to moderate on the right side   Sensory examination: Soft touch sensation is symmetric on the face, arms, and legs.  Coordination: The patient has good finger-nose-finger and heel-to-shin bilaterally.  Gait and station: Can stand from seated position with arms crossed. Decreased arm swing on the right, en bloc turns , wide based gait  Reflexes: Deep tendon reflexes are symmetric.  ALLERGIES: Allergies  Allergen Reactions   Lisinopril Nausea Only   Other     Poliovax caused fatigue.    HOME MEDICATIONS: Outpatient Medications Prior to Visit  Medication  Sig Dispense Refill   amLODipine (NORVASC) 5 MG tablet Take 5 mg by mouth daily.     atorvastatin (LIPITOR) 10 MG tablet Take 10 mg by mouth daily.     carbidopa-levodopa (SINEMET IR) 25-100 MG tablet TAKE 1 TABLET BY MOUTH FOUR TIMES DAILY 360 tablet 3   irbesartan (AVAPRO) 300 MG tablet Take 300 mg by mouth daily.      Polyvinyl Alcohol-Povidone (REFRESH OP) Apply 1 drop to eye as needed (dry eye).      vitamin B-12 (CYANOCOBALAMIN) 1000 MCG tablet Take 1,000 mcg by mouth daily.     No facility-administered medications prior to visit.    PAST MEDICAL HISTORY: Past Medical History:  Diagnosis Date   Abnormal gait    Diabetes (HCC)    Hypercholesteremia    Hypertension    Parkinson disease  (HCC)    Seasonal allergies     PAST SURGICAL HISTORY: Past Surgical History:  Procedure Laterality Date   APPENDECTOMY     CHOLECYSTECTOMY     COLONOSCOPY     normal screening   COLONOSCOPY N/A 05/27/2019   Procedure: COLONOSCOPY;  Surgeon: Corbin Ade, MD;  Location[facility];  Service: Endoscopy;  Laterality: N/A;  8:15am   HERNIA REPAIR      FAMILY HISTORY: Family History  Problem Relation Age of Onset   Alzheimer's disease Mother    Diabetes Mother    Hypertension Mother    Other Father        potential complications from hip surgery   Colon cancer Neg Hx     SOCIAL HISTORY: Social History   Socioeconomic History   Marital status: Married    Spouse name: [name]   Number of children: 2   Years of education: 12   Highest education level: High school graduate  Occupational History   Occupation: Retired  Tobacco Use   Smoking status: Never   Smokeless tobacco: Never  Vaping Use   Vaping status: Never Used  Substance and Sexual Activity   Alcohol use: Not Currently   Drug use: Never   Sexual activity: Yes    Birth control/protection: None  Other Topics Concern   Not on file  Social History Narrative   Lives at home with wife.    Right-handed.   Rare caffeine use.   Social Drivers of Corporate investment banker Strain: Not on file  Food Insecurity: Not on file  Transportation Needs: Not on file  Physical Activity: Not on file  Stress: Not on file  Social Connections: Not on file  Intimate Partner Violence: Not on file   Margie Ege, Edrick Oh, DNP  [facility] [address], [address] [place], [place] [address] [phone]

## 2023-06-18 ENCOUNTER — Encounter: Payer: Self-pay | Admitting: Neurology

## 2023-06-18 ENCOUNTER — Ambulatory Visit (INDEPENDENT_AMBULATORY_CARE_PROVIDER_SITE_OTHER): Payer: Medicare Other | Admitting: Neurology

## 2023-06-18 VITALS — BP 137/74 | HR 85 | Ht 69.0 in | Wt 123.0 lb

## 2023-06-18 DIAGNOSIS — G20A1 Parkinson's disease without dyskinesia, without mention of fluctuations: Secondary | ICD-10-CM

## 2023-06-18 MED ORDER — CARBIDOPA-LEVODOPA 25-100 MG PO TABS: 1.0000 | ORAL_TABLET | Freq: Four times a day (QID) | ORAL | 3 refills | Status: AC

## 2023-06-18 NOTE — Patient Instructions (Signed)
 Try taking Sinemet 30-60 before a meal to see if any benefit to swallowing. Referral to Speech Therapy. Continue exercise. Follow up in 6 months with [name].

## 2023-06-18 NOTE — Progress Notes (Signed)
 Chart reviewed, agree above plan ?

## 2023-08-20 DIAGNOSIS — E1140 Type 2 diabetes mellitus with diabetic neuropathy, unspecified: Secondary | ICD-10-CM | POA: Diagnosis not present

## 2023-08-20 DIAGNOSIS — M79671 Pain in right foot: Secondary | ICD-10-CM | POA: Diagnosis not present

## 2023-08-20 DIAGNOSIS — I739 Peripheral vascular disease, unspecified: Secondary | ICD-10-CM | POA: Diagnosis not present

## 2023-08-20 DIAGNOSIS — I152 Hypertension secondary to endocrine disorders: Secondary | ICD-10-CM | POA: Diagnosis not present

## 2023-08-20 DIAGNOSIS — L110 Acquired keratosis follicularis: Secondary | ICD-10-CM | POA: Diagnosis not present

## 2023-08-20 DIAGNOSIS — M79672 Pain in left foot: Secondary | ICD-10-CM | POA: Diagnosis not present

## 2023-08-20 DIAGNOSIS — G20A1 Parkinson's disease without dyskinesia, without mention of fluctuations: Secondary | ICD-10-CM | POA: Diagnosis not present

## 2023-08-20 DIAGNOSIS — E1159 Type 2 diabetes mellitus with other circulatory complications: Secondary | ICD-10-CM | POA: Diagnosis not present

## 2023-08-20 DIAGNOSIS — H6123 Impacted cerumen, bilateral: Secondary | ICD-10-CM | POA: Diagnosis not present

## 2023-08-20 DIAGNOSIS — Z299 Encounter for prophylactic measures, unspecified: Secondary | ICD-10-CM | POA: Diagnosis not present

## 2023-08-21 DIAGNOSIS — H35432 Paving stone degeneration of retina, left eye: Secondary | ICD-10-CM | POA: Diagnosis not present

## 2023-08-21 DIAGNOSIS — H01001 Unspecified blepharitis right upper eyelid: Secondary | ICD-10-CM | POA: Diagnosis not present

## 2023-08-21 DIAGNOSIS — H02105 Unspecified ectropion of left lower eyelid: Secondary | ICD-10-CM | POA: Diagnosis not present

## 2023-08-21 DIAGNOSIS — H524 Presbyopia: Secondary | ICD-10-CM | POA: Diagnosis not present

## 2023-08-21 DIAGNOSIS — H52223 Regular astigmatism, bilateral: Secondary | ICD-10-CM | POA: Diagnosis not present

## 2023-08-21 DIAGNOSIS — H02102 Unspecified ectropion of right lower eyelid: Secondary | ICD-10-CM | POA: Diagnosis not present

## 2023-08-21 DIAGNOSIS — H01004 Unspecified blepharitis left upper eyelid: Secondary | ICD-10-CM | POA: Diagnosis not present

## 2023-08-21 DIAGNOSIS — H02055 Trichiasis without entropian left lower eyelid: Secondary | ICD-10-CM | POA: Diagnosis not present

## 2023-08-21 DIAGNOSIS — H02052 Trichiasis without entropian right lower eyelid: Secondary | ICD-10-CM | POA: Diagnosis not present

## 2023-08-21 DIAGNOSIS — H2513 Age-related nuclear cataract, bilateral: Secondary | ICD-10-CM | POA: Diagnosis not present

## 2023-08-21 DIAGNOSIS — H5203 Hypermetropia, bilateral: Secondary | ICD-10-CM | POA: Diagnosis not present

## 2023-08-23 DIAGNOSIS — Z299 Encounter for prophylactic measures, unspecified: Secondary | ICD-10-CM | POA: Diagnosis not present

## 2023-08-23 DIAGNOSIS — H6123 Impacted cerumen, bilateral: Secondary | ICD-10-CM | POA: Diagnosis not present

## 2023-09-05 DIAGNOSIS — G20A1 Parkinson's disease without dyskinesia, without mention of fluctuations: Secondary | ICD-10-CM | POA: Diagnosis not present

## 2023-09-05 DIAGNOSIS — Z299 Encounter for prophylactic measures, unspecified: Secondary | ICD-10-CM | POA: Diagnosis not present

## 2023-09-05 DIAGNOSIS — I10 Essential (primary) hypertension: Secondary | ICD-10-CM | POA: Diagnosis not present

## 2023-09-05 DIAGNOSIS — E1165 Type 2 diabetes mellitus with hyperglycemia: Secondary | ICD-10-CM | POA: Diagnosis not present

## 2023-10-18 DIAGNOSIS — Z0000 Encounter for general adult medical examination without abnormal findings: Secondary | ICD-10-CM | POA: Diagnosis not present

## 2023-10-18 DIAGNOSIS — Z79899 Other long term (current) drug therapy: Secondary | ICD-10-CM | POA: Diagnosis not present

## 2023-10-18 DIAGNOSIS — Z299 Encounter for prophylactic measures, unspecified: Secondary | ICD-10-CM | POA: Diagnosis not present

## 2023-10-18 DIAGNOSIS — Z7189 Other specified counseling: Secondary | ICD-10-CM | POA: Diagnosis not present

## 2023-10-18 DIAGNOSIS — I10 Essential (primary) hypertension: Secondary | ICD-10-CM | POA: Diagnosis not present

## 2023-10-18 DIAGNOSIS — Z1331 Encounter for screening for depression: Secondary | ICD-10-CM | POA: Diagnosis not present

## 2023-10-18 DIAGNOSIS — Z1339 Encounter for screening examination for other mental health and behavioral disorders: Secondary | ICD-10-CM | POA: Diagnosis not present

## 2023-10-18 DIAGNOSIS — Z125 Encounter for screening for malignant neoplasm of prostate: Secondary | ICD-10-CM | POA: Diagnosis not present

## 2023-10-18 DIAGNOSIS — R5383 Other fatigue: Secondary | ICD-10-CM | POA: Diagnosis not present

## 2023-10-18 DIAGNOSIS — E7800 Pure hypercholesterolemia, unspecified: Secondary | ICD-10-CM | POA: Diagnosis not present

## 2023-11-04 DIAGNOSIS — M79671 Pain in right foot: Secondary | ICD-10-CM | POA: Diagnosis not present

## 2023-11-04 DIAGNOSIS — I739 Peripheral vascular disease, unspecified: Secondary | ICD-10-CM | POA: Diagnosis not present

## 2023-11-04 DIAGNOSIS — M79672 Pain in left foot: Secondary | ICD-10-CM | POA: Diagnosis not present

## 2023-11-04 DIAGNOSIS — L110 Acquired keratosis follicularis: Secondary | ICD-10-CM | POA: Diagnosis not present

## 2023-11-04 DIAGNOSIS — E1140 Type 2 diabetes mellitus with diabetic neuropathy, unspecified: Secondary | ICD-10-CM | POA: Diagnosis not present

## 2023-12-24 ENCOUNTER — Ambulatory Visit: Admitting: Neurology

## 2024-01-09 ENCOUNTER — Ambulatory Visit: Admitting: Neurology

## 2024-01-13 DIAGNOSIS — E1140 Type 2 diabetes mellitus with diabetic neuropathy, unspecified: Secondary | ICD-10-CM | POA: Diagnosis not present

## 2024-01-13 DIAGNOSIS — I739 Peripheral vascular disease, unspecified: Secondary | ICD-10-CM | POA: Diagnosis not present

## 2024-01-13 DIAGNOSIS — M79672 Pain in left foot: Secondary | ICD-10-CM | POA: Diagnosis not present

## 2024-01-13 DIAGNOSIS — L110 Acquired keratosis follicularis: Secondary | ICD-10-CM | POA: Diagnosis not present

## 2024-01-13 DIAGNOSIS — M79671 Pain in right foot: Secondary | ICD-10-CM | POA: Diagnosis not present

## 2024-02-10 DIAGNOSIS — Z299 Encounter for prophylactic measures, unspecified: Secondary | ICD-10-CM | POA: Diagnosis not present

## 2024-02-10 DIAGNOSIS — E7800 Pure hypercholesterolemia, unspecified: Secondary | ICD-10-CM | POA: Diagnosis not present

## 2024-02-10 DIAGNOSIS — E119 Type 2 diabetes mellitus without complications: Secondary | ICD-10-CM | POA: Diagnosis not present

## 2024-02-10 DIAGNOSIS — I10 Essential (primary) hypertension: Secondary | ICD-10-CM | POA: Diagnosis not present
